# Patient Record
Sex: Female | Born: 1985 | Race: Black or African American | Hispanic: No | Marital: Married | Smoking: Current some day smoker
Health system: Southern US, Community
[De-identification: ages and names within clinical notes are randomized; demographics above are authoritative.]

## PROBLEM LIST (undated history)

## (undated) DIAGNOSIS — R87629 Unspecified abnormal cytological findings in specimens from vagina: Secondary | ICD-10-CM

## (undated) DIAGNOSIS — F410 Panic disorder [episodic paroxysmal anxiety] without agoraphobia: Secondary | ICD-10-CM

## (undated) DIAGNOSIS — O039 Complete or unspecified spontaneous abortion without complication: Secondary | ICD-10-CM

## (undated) HISTORY — DX: Unspecified abnormal cytological findings in specimens from vagina: R87.629

## (undated) HISTORY — PX: HAND SURGERY: SHX662

## (undated) HISTORY — PX: WISDOM TOOTH EXTRACTION: SHX21

## (undated) HISTORY — PX: LEEP: SHX91

## (undated) HISTORY — DX: Complete or unspecified spontaneous abortion without complication: O03.9

---
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
## 2005-06-29 ENCOUNTER — Observation Stay (HOSPITAL_COMMUNITY): Admission: RE | Admit: 2005-06-29 | Discharge: 2005-06-30 | Payer: Self-pay | Admitting: Obstetrics & Gynecology

## 2005-07-10 ENCOUNTER — Observation Stay: Payer: Self-pay | Admitting: Obstetrics & Gynecology

## 2005-07-14 ENCOUNTER — Ambulatory Visit (HOSPITAL_COMMUNITY): Admission: RE | Admit: 2005-07-14 | Discharge: 2005-07-14 | Payer: Self-pay | Admitting: *Deleted

## 2005-07-31 ENCOUNTER — Observation Stay (HOSPITAL_COMMUNITY): Admission: AD | Admit: 2005-07-31 | Discharge: 2005-07-31 | Payer: Self-pay | Admitting: *Deleted

## 2005-08-10 ENCOUNTER — Observation Stay: Payer: Self-pay | Admitting: Obstetrics & Gynecology

## 2005-08-16 ENCOUNTER — Observation Stay: Payer: Self-pay | Admitting: Obstetrics & Gynecology

## 2005-08-19 ENCOUNTER — Inpatient Hospital Stay: Payer: Self-pay

## 2008-03-02 ENCOUNTER — Emergency Department (HOSPITAL_COMMUNITY): Admission: EM | Admit: 2008-03-02 | Discharge: 2008-03-03 | Payer: Self-pay | Admitting: Emergency Medicine

## 2010-01-06 ENCOUNTER — Ambulatory Visit: Payer: Self-pay | Admitting: Obstetrics & Gynecology

## 2010-01-06 ENCOUNTER — Other Ambulatory Visit: Admission: RE | Admit: 2010-01-06 | Discharge: 2010-01-06 | Payer: Self-pay | Admitting: Obstetrics & Gynecology

## 2010-09-30 ENCOUNTER — Emergency Department (HOSPITAL_COMMUNITY)
Admission: EM | Admit: 2010-09-30 | Discharge: 2010-09-30 | Disposition: A | Discharge status: Home / Self Care | Payer: Medicaid Other | Attending: Emergency Medicine | Admitting: Emergency Medicine

## 2010-09-30 DIAGNOSIS — R079 Chest pain, unspecified: Secondary | ICD-10-CM | POA: Insufficient documentation

## 2010-09-30 DIAGNOSIS — F410 Panic disorder [episodic paroxysmal anxiety] without agoraphobia: Secondary | ICD-10-CM | POA: Insufficient documentation

## 2010-09-30 DIAGNOSIS — R0989 Other specified symptoms and signs involving the circulatory and respiratory systems: Secondary | ICD-10-CM | POA: Insufficient documentation

## 2010-09-30 DIAGNOSIS — R0609 Other forms of dyspnea: Secondary | ICD-10-CM | POA: Insufficient documentation

## 2010-10-05 ENCOUNTER — Emergency Department (HOSPITAL_COMMUNITY)
Admission: EM | Admit: 2010-10-05 | Discharge: 2010-10-06 | Disposition: A | Discharge status: Home / Self Care | Payer: Medicaid Other | Attending: Emergency Medicine | Admitting: Emergency Medicine

## 2010-10-05 DIAGNOSIS — R109 Unspecified abdominal pain: Secondary | ICD-10-CM | POA: Insufficient documentation

## 2010-10-05 DIAGNOSIS — N926 Irregular menstruation, unspecified: Secondary | ICD-10-CM | POA: Insufficient documentation

## 2010-10-15 NOTE — Discharge Summary (Signed)
NAMEKEI, LANGHORST          ACCOUNT NO.:  [id number]   MEDICAL RECORD NO.:  [id number]          PATIENT TYPE:  OIB   LOCATION:  A415                          FACILITY:  [facility]   PHYSICIAN:  Langley Gauss, MD     DATE OF BIRTH:  10-May-1986   DATE OF ADMISSION:  07/14/2005  DATE OF DISCHARGE:  02/15/2007LH                                 DISCHARGE SUMMARY   HISTORY OF PRESENT ILLNESS:  The patient is a 25 year old, G3, P1, with one  prior vaginal delivery 6 years ago at age 32, one prior spontaneous AB who  presents to [facility] essentially as an OB unassigned patient at  82 weeks' gestation.  She has received care through [facility] with the usual arrangement that she is seen from residents from  [facility].  All high-risk evaluations are performed at [facility] with  intent on eventually delivering at [facility].  The patient has had 2- to 3-  week duration now of bilateral hip pain which actually extends from the  iliac crest down to the round ligaments bilaterally.  She denies any  significant change in her vaginal discharge.  There is no itching, bleeding  or odor.  She does have good fetal movement.  She states she has had no  problems with prenatal care through this pregnancy with the exception of my  conclusion that she has some discontinuity of her prenatal care due to  multiple visits.  The note indicates that initial ultrasound performed at 61-  1/2 weeks' gestation at [facility] was essentially within normal limits.  Subsequently during this past week, she has had same complaints as she  presents tonight.  She has been seen at [facility] and also at [facility].  She states that no definite diagnosis has been given to her other than  threatened preterm labor.  She was given a prescription for Darvocet by one  of the training physicians from [facility].  On subsequent visit, another  training physician advised her against taking this.   MEDICATIONS:  1.  Prenatal vitamins.  2.  Recently completed a course of amoxicillin on May 26, 2005.  3.  Ventolin inhaler for occasional wheezing.   PAST MEDICAL HISTORY:  One prior vaginal delivery who is now 25 years old.  She does have other family members to provide complete care for that child.  The remainder of past medical history is otherwise noncontributory.  The  patient otherwise has no medical or surgical history.  She denies any  previous history of chronic back pain.  She also denies any history which  could be consistent with irritable bowel syndrome.   PAST OBSTETRICAL HISTORY:  One term pregnancy, one spontaneous AB and this  current pregnancy.   PRENATAL LABORATORY DATA:  Interestingly, in reviewing her prenatal record  from [facility], the patient refused performance of an HIV test on July 07, 2005.  Otherwise, RPR is nonreactive.  Hemoglobin 11.9.  O positive blood  type.  RPR nonreactive.  GC and Chlamydia cultures negative at the time at  which they were performed.  Hepatitis B surface antigen was negative.   PHYSICAL EXAMINATION:  GENERAL:  She is noted to be in no acute distress,  but she did require some assistance in sitting up from the wheelchair.  VITAL SIGNS:  Blood pressure 125/70, pulse 80, respirations 20.  HEENT:  Negative.  No adenopathy.  NECK:  Supple.  Thyroid nonpalpable.  LUNGS:  Clear.  CARDIAC:  Regular rate and rhythm.  ABDOMEN:  Soft and nontender.  The patient does complain of some pain with  manipulation of the abdominal wall which does seem to be stretched fairly  tightly in this small patient at this advanced gestation.  Fundal height is  measured at 36 cm.  Presenting part indeterminate.  External fetal monitor  reveals a reassuring fetal heart rate.  I did request a nonstress test for  the indication of threatened preterm labor.  The patient is known to be  contracting about every 8 minutes of very mild intensity.  Fetal  heart rate  is reassuring.  Reactive nonstress test.  PELVIC:  Cervix is closed.  Vertex is not engaged.  Limited OB ultrasound by  Dr. Lisette Grinder reveals vertex presentation, normal amniotic fluid volume, good  fetal movement identified, posterior placenta with no placenta previa.  No  retroplacental clots are identified.  Normal amniotic fluid volume.  Fetal  cardiac activity is seen.  Vertex presentation is confirmed.   LABORATORY DATA AND X-RAY FINDINGS:  Urinalysis which is completely negative  with very small amounts of leukocyte esterase.  Urine drug screen is  completely negative.  Urinalysis all pertinent for many epithelial cells, 0-  2 wbc's and only rare bacteria.  Hemoglobin 11.2, hematocrit 32.8 with a  white count of 8.8.   OBSERVATION SUMMARY:  On examination, the patient has bilateral lumbar back  pain, but no appreciable area of point tenderness, no prior history of back  disorders.  She is having varying episodic uterine contractions, but not  significant enough to result in any dilatation.  Thus, at present, it seems  that the bilateral lumbar and hip pain which the patient is complaining  about could be considered a normal part of her pregnancy with an accentuated  lumbar lordosis and weakening of the rectus muscle.  The patient, prior to  discharge, did have one episode of diarrhea and has been treated with  Lomotil 2 p.o. x1 and discharged to home on Lomotil 2 p.o. and then one  every 6 hours p.r.n.  She also is given a prescription for Lortab.  The  patient is advised to continue to follow up with her previous prenatal  Ay[facility] Andes.      Langley Gauss, MD  Electronically Signed     DC/MEDQ  D:  07/14/2005  T:  07/15/2005  Job:  [id number]   cc:   [facility]

## 2010-10-15 NOTE — Discharge Summary (Signed)
Lisa Hanna, Lisa Hanna          ACCOUNT NO.:  [id number]   MEDICAL RECORD NO.:  [id number]          PATIENT TYPE:  OIB   LOCATION:  A415                          FACILITY:  [facility]   PHYSICIAN:  Langley Gauss, MD     DATE OF BIRTH:  Sep 14, 1985   DATE OF ADMISSION:  07/31/2005  DATE OF DISCHARGE:  03/04/2007LH                                 DISCHARGE SUMMARY   HISTORY OF PRESENT ILLNESS:  The patient is a 25 year old, black female who  is essentially unassigned receiving prenatal care at [facility].  She is a G3, P1 at about 36+ weeks' gestation and initially presented  complaining of pain under her right breast.  She was subsequently noted to  have  irregular uterine contractions.   PRENATAL COURSE:  Her entire prenatal record is reviewed including a  previous dictation when the patient was completely evaluated by me as an OB  unassigned on labor and delivery on July 14, 2005.  The patient's past  medical history and prenatal course is essentially unchanged from that time.   PHYSICAL EXAMINATION:  GENERAL:  She is in no acute distress.  VITAL SIGNS:  Blood pressure 115/70, pulse rate 80, respirations 20.  ABDOMEN:  Soft and nontender.  Gravid uterus is identified.  Vertex  presentation with Leopold's maneuvers.  Full bladder.  PELVIC:  Normal external genitalia, no lesions or ulcerations identified.  Cervix is 1-2 with presenting part nonengaged and 50% effaced.  External  fetal monitor reveals reassuring fetal heart rate.  No decelerations noted.  Irregular mild uterine contractions.   ASSESSMENT:  1.  A 36-week intrauterine pregnancy with breast pain with undefined      etiology.  2.  Abdominal pain with false labor.  Signs and symptoms of the labor is      reviewed with the patient.   PLAN:  She is discharged to home at this time and will likely continue her  prenatal care through [facility].      Langley Gauss, MD  Electronically  Signed     DC/MEDQ  D:  07/31/2005  T:  08/01/2005  Job:  [id number]

## 2010-10-15 NOTE — H&P (Signed)
Lisa Hanna, Lisa Hanna          ACCOUNT NO.:  [id number]   MEDICAL RECORD NO.:  [id number]          PATIENT TYPE:  OIB   LOCATION:  A415                          FACILITY:  [facility]   PHYSICIAN:  Tilda Burrow, M.D. DATE OF BIRTH:  1986/01/13   DATE OF ADMISSION:  06/29/2005  DATE OF DISCHARGE:  LH                                HISTORY & PHYSICAL   OBSERVATION ADMISSION   REASON FOR ADMISSION:  Pregnancy at 32 weeks and 2 days. Complaining of  uterine contractions that have been regular since early a.m.   MEDICAL HISTORY:  Negative.   SURGICAL HISTORY:  Negative.   PHYSICAL EXAMINATION:  Today, vital signs are stable.  She is having some  uterine contractions which we start terbutaline protocol on.  The cervix is  a floppy, 2 cm, very thick, presenting part is in the pelvis.   PLAN:  We are going to admit for preterm uterine contractions, due to the  terbutaline protocol, obtain cultures, start her on IV antibiotics, observe  during the course of the night.  If there is no change in her condition we  will send her home in the morning.      Lisa Hanna, Lisa Hanna      Tilda Burrow, M.D.  Electronically Signed    DL/MEDQ  D:  16/02/9603  T:  06/29/2005  Job:  [id number]   cc:   [facility]   [facility]

## 2010-11-11 ENCOUNTER — Other Ambulatory Visit (HOSPITAL_COMMUNITY): Payer: Self-pay | Admitting: *Deleted

## 2010-11-16 ENCOUNTER — Ambulatory Visit (HOSPITAL_COMMUNITY)
Admission: RE | Admit: 2010-11-16 | Discharge: 2010-11-16 | Disposition: A | Discharge status: Home / Self Care | Payer: Medicaid Other | Source: Ambulatory Visit | Attending: *Deleted | Admitting: *Deleted

## 2010-11-16 ENCOUNTER — Ambulatory Visit (HOSPITAL_COMMUNITY)
Admission: RE | Admit: 2010-11-16 | Payer: Medicaid Other | Source: Ambulatory Visit | Attending: *Deleted | Admitting: *Deleted

## 2010-11-16 ENCOUNTER — Ambulatory Visit (HOSPITAL_COMMUNITY)
Admission: RE | Admit: 2010-11-16 | Discharge: 2010-11-16 | Disposition: A | Discharge status: Home / Self Care | Payer: Medicaid Other | Source: Ambulatory Visit | Attending: Internal Medicine | Admitting: Internal Medicine

## 2010-11-16 DIAGNOSIS — R109 Unspecified abdominal pain: Secondary | ICD-10-CM | POA: Insufficient documentation

## 2010-11-16 DIAGNOSIS — N949 Unspecified condition associated with female genital organs and menstrual cycle: Secondary | ICD-10-CM | POA: Insufficient documentation

## 2010-11-27 DIAGNOSIS — Z9889 Other specified postprocedural states: Secondary | ICD-10-CM

## 2010-11-27 DIAGNOSIS — IMO0002 Reserved for concepts with insufficient information to code with codable children: Secondary | ICD-10-CM

## 2010-12-13 ENCOUNTER — Encounter: Payer: Medicaid Other | Admitting: Family Medicine

## 2011-02-24 ENCOUNTER — Encounter: Payer: Medicaid Other | Admitting: Family Medicine

## 2011-02-24 ENCOUNTER — Encounter: Payer: Self-pay | Admitting: Obstetrics & Gynecology

## 2011-09-02 ENCOUNTER — Encounter (HOSPITAL_COMMUNITY): Payer: Self-pay

## 2011-09-02 ENCOUNTER — Emergency Department (HOSPITAL_COMMUNITY)
Admission: EM | Admit: 2011-09-02 | Discharge: 2011-09-02 | Disposition: A | Discharge status: Home / Self Care | Payer: Medicaid Other | Attending: Emergency Medicine | Admitting: Emergency Medicine

## 2011-09-02 DIAGNOSIS — B9689 Other specified bacterial agents as the cause of diseases classified elsewhere: Secondary | ICD-10-CM | POA: Insufficient documentation

## 2011-09-02 DIAGNOSIS — N939 Abnormal uterine and vaginal bleeding, unspecified: Secondary | ICD-10-CM

## 2011-09-02 DIAGNOSIS — N760 Acute vaginitis: Secondary | ICD-10-CM | POA: Insufficient documentation

## 2011-09-02 DIAGNOSIS — N898 Other specified noninflammatory disorders of vagina: Secondary | ICD-10-CM | POA: Insufficient documentation

## 2011-09-02 DIAGNOSIS — A499 Bacterial infection, unspecified: Secondary | ICD-10-CM | POA: Insufficient documentation

## 2011-09-02 DIAGNOSIS — R109 Unspecified abdominal pain: Secondary | ICD-10-CM | POA: Insufficient documentation

## 2011-09-02 HISTORY — DX: Panic disorder (episodic paroxysmal anxiety): F41.0

## 2011-09-02 LAB — URINALYSIS, ROUTINE W REFLEX MICROSCOPIC
Ketones, ur: NEGATIVE mg/dL
Leukocytes, UA: NEGATIVE
Nitrite: NEGATIVE
Protein, ur: NEGATIVE mg/dL
Urobilinogen, UA: 0.2 mg/dL (ref 0.0–1.0)

## 2011-09-02 LAB — CBC
HCT: 36.8 % (ref 36.0–46.0)
Hemoglobin: 12 g/dL (ref 12.0–15.0)
MCHC: 32.6 g/dL (ref 30.0–36.0)
MCV: 103.7 fL — ABNORMAL HIGH (ref 78.0–100.0)
RDW: 12.3 % (ref 11.5–15.5)

## 2011-09-02 LAB — BASIC METABOLIC PANEL
BUN: 6 mg/dL (ref 6–23)
CO2: 26 mEq/L (ref 19–32)
Calcium: 9.4 mg/dL (ref 8.4–10.5)
Chloride: 103 mEq/L (ref 96–112)
Creatinine, Ser: 0.68 mg/dL (ref 0.50–1.10)
GFR calc Af Amer: >90 mL/min (ref >90)

## 2011-09-02 LAB — DIFFERENTIAL
Basophils Absolute: 0 10*3/uL (ref 0.0–0.1)
Basophils Relative: 0 % (ref 0–1)
Eosinophils Relative: 0 % (ref 0–5)
Monocytes Absolute: 0.3 10*3/uL (ref 0.1–1.0)
Monocytes Relative: 7 % (ref 3–12)
Neutro Abs: 3 10*3/uL (ref 1.7–7.7)

## 2011-09-02 LAB — POCT PREGNANCY, URINE: Preg Test, Ur: NEGATIVE

## 2011-09-02 LAB — WET PREP, GENITAL: Yeast Wet Prep HPF POC: NONE SEEN

## 2011-09-02 MED ORDER — METRONIDAZOLE 500 MG PO TABS: 500.0000 mg | ORAL_TABLET | Freq: Two times a day (BID) | ORAL | Status: AC

## 2011-09-02 NOTE — ED Notes (Signed)
Pt presents with abdominal cramping and vaginal bleeding. Pt states she was just placed on Ortho Evra patch.

## 2011-09-02 NOTE — Discharge Instructions (Signed)
Bacterial Vaginosis Bacterial vaginosis is an infection of the vagina. A healthy vagina has many kinds of good germs (bacteria). Sometimes the number of good germs can change. This allows bad germs to move in and cause an infection. You may be given medicine (antibiotics) to treat the infection. Or, you may not need treatment at all. HOME CARE  Take your medicine as told. Finish them even if you start to feel better.   Do not have sex until you finish your medicine.   Do not douche.   Practice safe sex.   Tell your sex partner that you have an infection. They should see their doctor for treatment if they have problems.  GET HELP RIGHT AWAY IF:  You do not get better after 3 days of treatment.   You have grey fluid (discharge) coming from your vagina.   You have pain.   You have a temperature of 102 F (38.9 C) or higher.  MAKE SURE YOU:   Understand these instructions.   Will watch your condition.   Will get help right away if you are not doing well or get worse.  Document Released: 02/23/2008 Document Revised: 05/05/2011 Document Reviewed: 02/23/2008 [facility] Patient Information 2012 [place], [place].Abnormal Vaginal Bleeding Abnormal vaginal bleeding means bleeding from the vagina that is not your normal menstrual period. Bleeding may be heavy or light. It may last for days or come and go. There are many problems that may cause this. HOME CARE  Keep track of your periods on a calendar if they are not regular.   Write down:   How often pads or tampons are changed.   The size and number of clots, if there are any.   A change in the color of the blood.   A change in the amount of blood.   Any smell.   The time and strength of cramps or pain.   Limit activity as told.   Eat a healthy diet.   Do not have sex (intercourse) until your doctor says it is okay.   Never have unprotected sex unless you are trying to get pregnant.   Only take medicine as told by your  doctor.  GET HELP RIGHT AWAY IF:   You get dizzy or feel faint when standing up.   You have to change pads or tampons more than once an hour.   You feel a sudden change in your pain.   You start bleeding heavily.   You develop a fever.  MAKE SURE YOU:  Understand these instructions.   Will watch your condition.   Will get help right away if you are not doing well or get worse.  Document Released: 03/13/2009 Document Revised: 05/05/2011 Document Reviewed: 03/13/2009 [facility] Patient Information 2012 [place], [place].

## 2011-09-02 NOTE — ED Notes (Signed)
Pt reports had recently stopped the depo shot and has been bleeding for 5 weeks.  Says was started on a birth control patch 2 weeks ago but is still bleeding and cramping.

## 2011-09-03 LAB — GC/CHLAMYDIA PROBE AMP, GENITAL: GC Probe Amp, Genital: NEGATIVE

## 2011-09-03 NOTE — ED Provider Notes (Signed)
History     CSN: [id number]  Arrival date & time 09/02/11  1200   First MD Initiated Contact with Patient 09/02/11 1318      Chief Complaint  Patient presents with  . Vaginal Bleeding  . Abdominal Cramping    (Consider location/radiation/quality/duration/timing/severity/associated sxs/prior treatment) HPI Comments: Patient complains of lower abdominal cramping and vaginal bleeding for several days. She describes the the cramping as similar to menstrual cramps. She states the cramping and bleeding have been intermittent. She also states that she has recently began using a Ortho Evra patch. She states at times the bleeding is heavier than a menstrual period.  She denies dysuria, fever, vaginal discharge, or back pain. She states that nothing makes her pain worse she has not taken any medications at home for cramping  Patient is a 26 y.o. female presenting with vaginal bleeding and cramps. The history is provided by the patient.  Vaginal Bleeding This is a new problem. The current episode started in the past 7 days. The problem occurs intermittently. The problem has been unchanged. Associated symptoms include abdominal pain. Pertinent negatives include no chest pain, chills, congestion, fever, headaches, nausea, neck pain, numbness, sore throat, vomiting or weakness. The symptoms are aggravated by nothing. She has tried nothing for the symptoms. The treatment provided no relief.  Abdominal Cramping The primary symptoms of the illness include abdominal pain and vaginal bleeding. The primary symptoms of the illness do not include fever, nausea, vomiting, dysuria or vaginal discharge.  Symptoms associated with the illness do not include chills, hematuria or back pain.    Past Medical History  Diagnosis Date  . Panic attack     History reviewed. No pertinent past surgical history.  No family history on file.  History  Substance Use Topics  . Smoking status: Never Smoker   . Smokeless  tobacco: Not on file  . Alcohol Use: No    OB History    Grav Para Term Preterm Abortions TAB SAB Ect Mult Living                  Review of Systems  Constitutional: Negative for fever and chills.  HENT: Negative for congestion, sore throat and neck pain.   Cardiovascular: Negative for chest pain.  Gastrointestinal: Positive for abdominal pain. Negative for nausea and vomiting.  Genitourinary: Positive for vaginal bleeding and menstrual problem. Negative for dysuria, hematuria, flank pain, vaginal discharge and difficulty urinating.  Musculoskeletal: Negative for back pain.  Skin: Negative.   Neurological: Negative for dizziness, weakness, numbness and headaches.  All other systems reviewed and are negative.    Allergies  Latex  Home Medications   Current Outpatient Rx  Name Route Sig Dispense Refill  . NORELGESTROMIN-ETH ESTRADIOL 150-20 MCG/24HR TD PTWK Transdermal Place 1 patch onto the skin once a week. Patient change on Thursday..    . METRONIDAZOLE 500 MG PO TABS Oral Take 1 tablet (500 mg total) by mouth 2 (two) times daily. For 7 days 14 tablet 0    BP 110/76  Pulse 85  Temp(Src) 97.8 F (36.6 C) (Oral)  Resp 20  Ht 5\' 6"  (1.676 m)  Wt 129 lb 12.8 oz (58.877 kg)  BMI 20.95 kg/m2  SpO2 100%  Physical Exam  Nursing note and vitals reviewed. Constitutional: She is oriented to person, place, and time. She appears well-developed and well-nourished. No distress.  HENT:  Head: Normocephalic and atraumatic.  Cardiovascular: Normal rate, regular rhythm and normal heart sounds.   Pulmonary/Chest:  Effort normal and breath sounds normal. No respiratory distress.  Abdominal: Soft. She exhibits no distension. There is no tenderness. There is no rebound and no guarding.  Genitourinary: Uterus normal. Cervix exhibits no motion tenderness. Right adnexum displays no mass and no tenderness. Left adnexum displays no mass and no tenderness. There is bleeding around the vagina.  No foreign body around the vagina. No vaginal discharge found.  Musculoskeletal: Normal range of motion. She exhibits no edema.  Neurological: She is alert and oriented to person, place, and time. She exhibits normal muscle tone. Coordination normal.  Skin: Skin is warm and dry.    ED Course  Procedures (including critical care time)  Results for orders placed during the hospital encounter of 09/02/11  URINALYSIS, ROUTINE W REFLEX MICROSCOPIC      Component Value Range   Color, Urine YELLOW  YELLOW    APPearance CLEAR  CLEAR    Specific Gravity, Urine <1.005 (*) 1.005 - 1.030    pH 6.5  5.0 - 8.0    Glucose, UA NEGATIVE  NEGATIVE (mg/dL)   Hgb urine dipstick SMALL (*) NEGATIVE    Bilirubin Urine NEGATIVE  NEGATIVE    Ketones, ur NEGATIVE  NEGATIVE (mg/dL)   Protein, ur NEGATIVE  NEGATIVE (mg/dL)   Urobilinogen, UA 0.2  0.0 - 1.0 (mg/dL)   Nitrite NEGATIVE  NEGATIVE    Leukocytes, UA NEGATIVE  NEGATIVE   POCT PREGNANCY, URINE      Component Value Range   Preg Test, Ur NEGATIVE  NEGATIVE   URINE MICROSCOPIC-ADD ON      Component Value Range   Squamous Epithelial / LPF FEW (*) RARE    WBC, UA 0-2  <3 (WBC/hpf)   RBC / HPF 3-6  <3 (RBC/hpf)  CBC      Component Value Range   WBC 4.9  4.0 - 10.5 (K/uL)   RBC 3.55 (*) 3.87 - 5.11 (MIL/uL)   Hemoglobin 12.0  12.0 - 15.0 (g/dL)   HCT 16.1  09.6 - 04.5 (%)   MCV 103.7 (*) 78.0 - 100.0 (fL)   MCH 33.8  26.0 - 34.0 (pg)   MCHC 32.6  30.0 - 36.0 (g/dL)   RDW 40.9  81.1 - 91.4 (%)   Platelets 296  150 - 400 (K/uL)  DIFFERENTIAL      Component Value Range   Neutrophils Relative 60  43 - 77 (%)   Neutro Abs 3.0  1.7 - 7.7 (K/uL)   Lymphocytes Relative 32  12 - 46 (%)   Lymphs Abs 1.6  0.7 - 4.0 (K/uL)   Monocytes Relative 7  3 - 12 (%)   Monocytes Absolute 0.3  0.1 - 1.0 (K/uL)   Eosinophils Relative 0  0 - 5 (%)   Eosinophils Absolute 0.0  0.0 - 0.7 (K/uL)   Basophils Relative 0  0 - 1 (%)   Basophils Absolute 0.0  0.0 - 0.1  (K/uL)  BASIC METABOLIC PANEL      Component Value Range   Sodium 138  135 - 145 (mEq/L)   Potassium 3.8  3.5 - 5.1 (mEq/L)   Chloride 103  96 - 112 (mEq/L)   CO2 26  19 - 32 (mEq/L)   Glucose, Bld 76  70 - 99 (mg/dL)   BUN 6  6 - 23 (mg/dL)   Creatinine, Ser 7.82  0.50 - 1.10 (mg/dL)   Calcium 9.4  8.4 - 95.6 (mg/dL)   GFR calc non Af Amer >90  >90 (  mL/min)   GFR calc Af Amer >90  >90 (mL/min)  WET PREP, GENITAL      Component Value Range   Yeast Wet Prep HPF POC NONE SEEN  NONE SEEN    Trich, Wet Prep NONE SEEN  NONE SEEN    Clue Cells Wet Prep HPF POC FEW (*) NONE SEEN    WBC, Wet Prep HPF POC RARE (*) NONE SEEN       1. Abnormal vaginal bleeding   2. Bacterial vaginosis     GC and Chlamydia culture is pending  MDM   Patient is smiling and alert. She is nontoxic appearing. Vital signs are stable. Abdomen remains soft and nontender without guarding or rebound. Abnormal vaginal bleeding is likely related to recently starting a birth control patch  Laboratory results from today's visit were reviewed by me and appear within normal limits. I will treat for probable bacterial vaginosis given results of the wet prep  Patient agrees to close followup with her gynecologist. I discussed the results of today's testing patient, she verbalized understanding and agrees to the care plan.   Brinkley Peet L. Emiliya Chretien, [place] 09/03/11 1021

## 2011-09-04 NOTE — ED Provider Notes (Signed)
Medical screening examination/treatment/procedure(s) were performed by non-physician practitioner and as supervising physician I was immediately available for consultation/collaboration.    Benny Lennert, MD 09/04/11 [phone]

## 2011-12-25 ENCOUNTER — Emergency Department (HOSPITAL_COMMUNITY)
Admission: EM | Admit: 2011-12-25 | Discharge: 2011-12-25 | Disposition: A | Discharge status: Home / Self Care | Payer: Medicaid Other | Attending: Emergency Medicine | Admitting: Emergency Medicine

## 2011-12-25 ENCOUNTER — Emergency Department (HOSPITAL_COMMUNITY): Payer: Medicaid Other

## 2011-12-25 ENCOUNTER — Encounter (HOSPITAL_COMMUNITY): Payer: Self-pay | Admitting: *Deleted

## 2011-12-25 DIAGNOSIS — S20219A Contusion of unspecified front wall of thorax, initial encounter: Secondary | ICD-10-CM

## 2011-12-25 DIAGNOSIS — R071 Chest pain on breathing: Secondary | ICD-10-CM | POA: Insufficient documentation

## 2011-12-25 MED ORDER — IBUPROFEN 800 MG PO TABS: 800.0000 mg | ORAL_TABLET | Freq: Three times a day (TID) | ORAL | Status: AC

## 2011-12-25 MED ORDER — HYDROCODONE-ACETAMINOPHEN 5-325 MG PO TABS: ORAL_TABLET | ORAL | Status: AC
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
## 2012-01-04 NOTE — ED Provider Notes (Signed)
Medical screening examination/treatment/procedure(s) were conducted as a shared visit with non-physician practitioner(s) and myself.  I personally evaluated the patient during the encounter.  Left lateral chest wall pain secondary to trauma. Chest x-ray negative.  Donnetta Hutching, MD 01/04/12 1455

## 2012-05-14 ENCOUNTER — Encounter (HOSPITAL_COMMUNITY): Payer: Self-pay

## 2012-05-14 ENCOUNTER — Emergency Department (HOSPITAL_COMMUNITY): Payer: Medicaid Other

## 2012-05-14 ENCOUNTER — Emergency Department (HOSPITAL_COMMUNITY)
Admission: EM | Admit: 2012-05-14 | Discharge: 2012-05-14 | Disposition: A | Discharge status: Home / Self Care | Payer: Medicaid Other | Attending: Emergency Medicine | Admitting: Emergency Medicine

## 2012-05-14 DIAGNOSIS — N760 Acute vaginitis: Secondary | ICD-10-CM | POA: Insufficient documentation

## 2012-05-14 DIAGNOSIS — Z3201 Encounter for pregnancy test, result positive: Secondary | ICD-10-CM | POA: Insufficient documentation

## 2012-05-14 DIAGNOSIS — Z8659 Personal history of other mental and behavioral disorders: Secondary | ICD-10-CM | POA: Insufficient documentation

## 2012-05-14 DIAGNOSIS — B9689 Other specified bacterial agents as the cause of diseases classified elsewhere: Secondary | ICD-10-CM

## 2012-05-14 DIAGNOSIS — Z3490 Encounter for supervision of normal pregnancy, unspecified, unspecified trimester: Secondary | ICD-10-CM

## 2012-05-14 DIAGNOSIS — R109 Unspecified abdominal pain: Secondary | ICD-10-CM | POA: Insufficient documentation

## 2012-05-14 LAB — CBC
MCH: 34 pg (ref 26.0–34.0)
MCHC: 33.7 g/dL (ref 30.0–36.0)
Platelets: 223 10*3/uL (ref 150–400)
RDW: 12 % (ref 11.5–15.5)

## 2012-05-14 LAB — WET PREP, GENITAL
Trich, Wet Prep: NONE SEEN
Yeast Wet Prep HPF POC: NONE SEEN

## 2012-05-14 LAB — URINALYSIS, ROUTINE W REFLEX MICROSCOPIC
Glucose, UA: NEGATIVE mg/dL
Ketones, ur: NEGATIVE mg/dL
Leukocytes, UA: NEGATIVE
Nitrite: NEGATIVE
Protein, ur: NEGATIVE mg/dL

## 2012-05-14 LAB — ABO/RH: ABO/RH(D): O POS

## 2012-05-14 LAB — PREGNANCY, URINE: Preg Test, Ur: POSITIVE — AB

## 2012-05-14 LAB — HCG, QUANTITATIVE, PREGNANCY: hCG, Beta Chain, Quant, S: 68472 m[IU]/mL — ABNORMAL HIGH (ref <5)

## 2012-05-14 MED ORDER — AZITHROMYCIN 250 MG PO TABS
1000.0000 mg | ORAL_TABLET | Freq: Once | ORAL | Status: AC
  Administered 2012-05-14: 1000 mg via ORAL
  Filled 2012-05-14: qty 4

## 2012-05-14 MED ORDER — CEFTRIAXONE SODIUM 250 MG IJ SOLR
250.0000 mg | Freq: Once | INTRAMUSCULAR | Status: AC
  Administered 2012-05-14: 250 mg via INTRAMUSCULAR
  Filled 2012-05-14: qty 250

## 2012-05-14 MED ORDER — ONDANSETRON 8 MG PO TBDP
8.0000 mg | ORAL_TABLET | Freq: Once | ORAL | Status: AC
  Administered 2012-05-14: 8 mg via ORAL
  Filled 2012-05-14: qty 1

## 2012-05-14 MED ORDER — METRONIDAZOLE 500 MG PO TABS: 500.0000 mg | ORAL_TABLET | Freq: Two times a day (BID) | ORAL | Status: DC

## 2012-05-14 MED ORDER — PRENATAL COMPLETE 14-0.4 MG PO TABS: 1.0000 | ORAL_TABLET | Freq: Every day | ORAL | Status: DC

## 2012-05-14 MED ORDER — ONDANSETRON HCL 4 MG PO TABS: 4.0000 mg | ORAL_TABLET | Freq: Three times a day (TID) | ORAL | Status: DC | PRN

## 2012-05-14 NOTE — ED Notes (Signed)
Pt reports n/v and some abd pain since Saturday.  Pt reports her LMP Nov 1.  Pt reports " I could be pregnant".

## 2012-05-14 NOTE — ED Provider Notes (Signed)
History     CSN: [id number]  Arrival date & time 05/14/12  4132   First MD Initiated Contact with Patient 05/14/12 [phone]      Chief Complaint  Patient presents with  . Nausea  . Emesis  . Abdominal Pain     HPI Pt was seen at 0800.   Per pt, c/o gradual onset and persistence of constant lower abd "pain" for the past 2 days.  Has been associated with several episodes of N/V.  States her LMP was approx 11/1 and is "not sure if I'm pregnant."  Denies dysuria, no back pain, no diarrhea, no vaginal bleeding/discharge, no fevers.    Past Medical History  Diagnosis Date  . Panic attack     History reviewed. No pertinent past surgical history.   History  Substance Use Topics  . Smoking status: Never Smoker   . Smokeless tobacco: Not on file  . Alcohol Use: No    Review of Systems ROS: Statement: All systems negative except as marked or noted in the HPI; Constitutional: Negative for fever and chills. ; ; Eyes: Negative for eye pain, redness and discharge. ; ; ENMT: Negative for ear pain, hoarseness, nasal congestion, sinus pressure and sore throat. ; ; Cardiovascular: Negative for chest pain, palpitations, diaphoresis, dyspnea and peripheral edema. ; ; Respiratory: Negative for cough, wheezing and stridor. ; ; Gastrointestinal: +N/V. Negative for diarrhea, abdominal pain, blood in stool, hematemesis, jaundice and rectal bleeding. . ; ; Genitourinary: Negative for dysuria, flank pain and hematuria. ; ; GYN:  +pelvic pain. No vaginal bleeding, no vaginal discharge, no vulvar pain.;;  Musculoskeletal: Negative for back pain and neck pain. Negative for swelling and trauma.; ; Skin: Negative for pruritus, rash, abrasions, blisters, bruising and skin lesion.; ; Neuro: Negative for headache, lightheadedness and neck stiffness. Negative for weakness, altered level of consciousness , altered mental status, extremity weakness, paresthesias, involuntary movement, seizure and syncope.        Allergies  Latex  Home Medications  No current outpatient prescriptions on file.  BP 100/70  Pulse 93  Temp 98.4 F (36.9 C) (Oral)  Resp 20  Ht 5\' 6"  (1.676 m)  Wt 133 lb 14.4 oz (60.737 kg)  BMI 21.61 kg/m2  SpO2 100%  LMP 04/19/2012  Physical Exam 0805: Physical examination:  Nursing notes reviewed; Vital signs and O2 SAT reviewed;  Constitutional: Well developed, Well nourished, Well hydrated, In no acute distress; Head:  Normocephalic, atraumatic; Eyes: EOMI, PERRL, No scleral icterus; ENMT: Mouth and pharynx normal, Mucous membranes moist; Neck: Supple, Full range of motion, No lymphadenopathy; Cardiovascular: Regular rate and rhythm, No murmur, rub, or gallop; Respiratory: Breath sounds clear & equal bilaterally, No rales, rhonchi, wheezes.  Speaking full sentences with ease, Normal respiratory effort/excursion; Chest: Nontender, Movement normal; Abdomen: Soft, +mild suprapubic tenderness to palp. No rebound or guarding. Nondistended, Normal bowel sounds; Genitourinary: No CVA tenderness; Pelvic exam performed with permission of pt and female ED tech assist during exam.  External genitalia w/o lesions. Vaginal vault with thick white discharge.  Cervix w/o lesions, not friable, GC/chlam and wet prep obtained and sent to lab.  Bimanual exam w/o CMT, uterine or adnexal tenderness.;; Extremities: Pulses normal, No tenderness, No edema, No calf edema or asymmetry.; Neuro: AA&Ox3, Major CN grossly intact.  Speech clear. No gross focal motor or sensory deficits in extremities.; Skin: Color normal, Warm, Dry.   ED Course  Procedures    MDM  MDM Reviewed: nursing note and vitals Interpretation: labs  and ultrasound     Results for orders placed during the hospital encounter of 05/14/12  PREGNANCY, URINE      Component Value Range   Preg Test, Ur POSITIVE (*) NEGATIVE  URINALYSIS, ROUTINE W REFLEX MICROSCOPIC      Component Value Range   Color, Urine YELLOW  YELLOW    APPearance CLEAR  CLEAR   Specific Gravity, Urine <1.005 (*) 1.005 - 1.030   pH 7.0  5.0 - 8.0   Glucose, UA NEGATIVE  NEGATIVE mg/dL   Hgb urine dipstick NEGATIVE  NEGATIVE   Bilirubin Urine NEGATIVE  NEGATIVE   Ketones, ur NEGATIVE  NEGATIVE mg/dL   Protein, ur NEGATIVE  NEGATIVE mg/dL   Urobilinogen, UA 0.2  0.0 - 1.0 mg/dL   Nitrite NEGATIVE  NEGATIVE   Leukocytes, UA NEGATIVE  NEGATIVE  WET PREP, GENITAL      Component Value Range   Yeast Wet Prep HPF POC NONE SEEN  NONE SEEN   Trich, Wet Prep NONE SEEN  NONE SEEN   Clue Cells Wet Prep HPF POC MODERATE (*) NONE SEEN   WBC, Wet Prep HPF POC MODERATE (*) NONE SEEN  CBC      Component Value Range   WBC 6.1  4.0 - 10.5 K/uL   RBC 3.68 (*) 3.87 - 5.11 MIL/uL   Hemoglobin 12.5  12.0 - 15.0 g/dL   HCT 45.4  09.8 - 11.9 %   MCV 100.8 (*) 78.0 - 100.0 fL   MCH 34.0  26.0 - 34.0 pg   MCHC 33.7  30.0 - 36.0 g/dL   RDW 14.7  82.9 - 56.2 %   Platelets 223  150 - 400 K/uL  ABO/RH      Component Value Range   ABO/RH(D) O POS    HCG, QUANTITATIVE, PREGNANCY      Component Value Range   hCG, Beta Chain, Quant, S 13086 (*) <5 mIU/mL   US Ob Comp Less 14 Wks 05/14/2012  *RADIOLOGY REPORT*  Clinical Data: First trimester pregnancy.  Pelvic pain.  OBSTETRIC <14 WK [place] AND TRANSVAGINAL OB US  Technique:  Both transabdominal and transvaginal ultrasound examinations were performed for complete evaluation of the gestation as well as the maternal uterus, adnexal regions, and pelvic cul-de-sac.  Transvaginal technique was performed to assess early pregnancy.  Comparison:  None.  Intrauterine gestational sac:  Visualized/normal in shape. Yolk sac: Visualized. Embryo: Visualized. Cardiac Activity: Visualized. Heart Rate: 122 bpm  CRL: 8.6  mm;  6-week-6-days        [place] EDC: 01/01/2013  Maternal uterus/adnexae: There is a small subchorionic hematoma.  Unfused amnion is noted. Probable corpus luteum is noted within the right ovary.  There is no adnexal mass  or significant free pelvic fluid.  IMPRESSION:  1.  Single live intrauterine gestation with best estimated gestational age of [redacted] weeks 6 days. 2.  Small subchorionic hematoma. 3.  No suspicious adnexal findings.   Original Report Authenticated By: Carey Bullocks, M.D.       [phone]:  Will tx for BV.  GC/chalm pending.  Dx and testing d/w pt.  Questions answered.  Verb understanding, agreeable to d/c home with outpt f/u with her OB.      Laray Anger, DO 05/16/12 [phone]

## 2012-05-15 LAB — GC/CHLAMYDIA PROBE AMP
CT Probe RNA: NEGATIVE
GC Probe RNA: NEGATIVE

## 2012-05-16 LAB — URINE CULTURE

## 2012-11-07 ENCOUNTER — Encounter (HOSPITAL_COMMUNITY): Payer: Self-pay | Admitting: Emergency Medicine

## 2012-11-07 ENCOUNTER — Emergency Department (HOSPITAL_COMMUNITY)
Admission: EM | Admit: 2012-11-07 | Discharge: 2012-11-07 | Disposition: A | Discharge status: Home / Self Care | Payer: Medicaid Other | Attending: Emergency Medicine | Admitting: Emergency Medicine

## 2012-11-07 DIAGNOSIS — X503XXA Overexertion from repetitive movements, initial encounter: Secondary | ICD-10-CM | POA: Insufficient documentation

## 2012-11-07 DIAGNOSIS — Y9389 Activity, other specified: Secondary | ICD-10-CM | POA: Insufficient documentation

## 2012-11-07 DIAGNOSIS — Z8659 Personal history of other mental and behavioral disorders: Secondary | ICD-10-CM | POA: Insufficient documentation

## 2012-11-07 DIAGNOSIS — S335XXA Sprain of ligaments of lumbar spine, initial encounter: Secondary | ICD-10-CM | POA: Insufficient documentation

## 2012-11-07 DIAGNOSIS — S39012A Strain of muscle, fascia and tendon of lower back, initial encounter: Secondary | ICD-10-CM

## 2012-11-07 DIAGNOSIS — Y929 Unspecified place or not applicable: Secondary | ICD-10-CM | POA: Insufficient documentation

## 2012-11-07 DIAGNOSIS — F17200 Nicotine dependence, unspecified, uncomplicated: Secondary | ICD-10-CM | POA: Insufficient documentation

## 2012-11-07 DIAGNOSIS — Z91040 Latex allergy status: Secondary | ICD-10-CM | POA: Insufficient documentation

## 2012-11-07 MED ORDER — TRAMADOL HCL 50 MG PO TABS: 50.0000 mg | ORAL_TABLET | Freq: Four times a day (QID) | ORAL | Status: DC | PRN

## 2012-11-07 MED ORDER — CYCLOBENZAPRINE HCL 10 MG PO TABS: 10.0000 mg | ORAL_TABLET | Freq: Two times a day (BID) | ORAL | Status: DC | PRN

## 2012-11-07 MED ORDER — NAPROXEN 500 MG PO TABS: 500.0000 mg | ORAL_TABLET | Freq: Two times a day (BID) | ORAL | Status: DC

## 2012-11-07 NOTE — ED Notes (Signed)
Pt c/o right lower back pain since lifting something heavy yesterday evening.

## 2012-11-07 NOTE — ED Provider Notes (Signed)
History     CSN: [id number]  Arrival date & time 11/07/12  1330   First MD Initiated Contact with Patient 11/07/12 1430      Chief Complaint  Patient presents with  . Back Pain    (Consider location/radiation/quality/duration/timing/severity/associated sxs/prior treatment) Patient is a 27 y.o. female presenting with back pain. The history is provided by the patient.  Back Pain Location:  Lumbar spine Quality:  Aching Radiates to:  Does not radiate Pain severity:  Mild Pain is:  Same all the time Onset quality:  Sudden Duration: Yesterday. Timing:  Constant Progression:  Unchanged Chronicity:  New Context: lifting heavy objects and twisting   Context comment:  Patient states that she felt a sharp pain to her lower back after bending over to pick up a cooler Relieved by:  Nothing Worsened by:  Bending, twisting and standing Ineffective treatments:  None tried Associated symptoms: no abdominal pain, no abdominal swelling, no bladder incontinence, no bowel incontinence, no chest pain, no dysuria, no fever, no headaches, no leg pain, no numbness, no paresthesias, no pelvic pain, no perianal numbness, no tingling and no weakness     Past Medical History  Diagnosis Date  . Panic attack     History reviewed. No pertinent past surgical history.  No family history on file.  History  Substance Use Topics  . Smoking status: Current Every Day Smoker -- 0.50 packs/day  . Smokeless tobacco: Not on file  . Alcohol Use: No    OB History   Grav Para Term Preterm Abortions TAB SAB Ect Mult Living                  Review of Systems  Constitutional: Negative for fever.  Respiratory: Negative for shortness of breath.   Cardiovascular: Negative for chest pain.  Gastrointestinal: Negative for vomiting, abdominal pain, constipation and bowel incontinence.  Genitourinary: Negative for bladder incontinence, dysuria, hematuria, flank pain, decreased urine volume, difficulty  urinating and pelvic pain.       No perineal numbness or incontinence of urine or feces  Musculoskeletal: Positive for back pain. Negative for joint swelling.  Skin: Negative for rash.  Neurological: Negative for tingling, weakness, numbness, headaches and paresthesias.  All other systems reviewed and are negative.    Allergies  Latex  Home Medications   Current Outpatient Rx  Name  Route  Sig  Dispense  Refill  . cyclobenzaprine (FLEXERIL) 10 MG tablet   Oral   Take 1 tablet (10 mg total) by mouth 2 (two) times daily as needed for muscle spasms.   20 tablet   0   . naproxen (NAPROSYN) 500 MG tablet   Oral   Take 1 tablet (500 mg total) by mouth 2 (two) times daily.   20 tablet   0   . traMADol (ULTRAM) 50 MG tablet   Oral   Take 1 tablet (50 mg total) by mouth every 6 (six) hours as needed for pain.   20 tablet   0     BP 103/78  Pulse 99  Temp(Src) 98.8 F (37.1 C) (Oral)  Resp 18  Ht 5\' 6"  (1.676 m)  Wt 141 lb (63.957 kg)  BMI 22.77 kg/m2  SpO2 100%  Physical Exam  Nursing note and vitals reviewed. Constitutional: She is oriented to person, place, and time. She appears well-developed and well-nourished. No distress.  HENT:  Head: Normocephalic and atraumatic.  Neck: Normal range of motion. Neck supple.  Cardiovascular: Normal rate, regular rhythm,  normal heart sounds and intact distal pulses.   No murmur heard. Pulmonary/Chest: Effort normal and breath sounds normal. No respiratory distress.  Musculoskeletal: She exhibits tenderness. She exhibits no edema.       Lumbar back: She exhibits tenderness and pain. She exhibits normal range of motion, no bony tenderness, no swelling, no deformity, no laceration and normal pulse.       Back:  ttp of the lumbar paraspinal muscles.  No spinal tenderness.  DP pulses are brisk and symmetrical.  Distal sensation intact.  Hip Flexors/Extensors are intact  Neurological: She is alert and oriented to person, place, and  time. No cranial nerve deficit or sensory deficit. She exhibits normal muscle tone. Coordination and gait normal.  Reflex Scores:      Patellar reflexes are 2+ on the right side and 2+ on the left side.      Achilles reflexes are 2+ on the right side and 2+ on the left side. Skin: Skin is warm and dry.    ED Course  Procedures (including critical care time)  Labs Reviewed - No data to display No results found.   1. Lumbar strain, initial encounter       MDM    Patient has ttp of the lumbar paraspinal muscles.  No focal neuro deficits on exam.  Ambulates with a steady gait. Doubt emergent neurological or infectious process. Likely musculoskeletal strain.   Patient agrees to alternate ice and heat to her back, avoid twisting or heavy lifting. I will prescribe Flexeril, Ultram, and Naprosyn for pain. She agrees to followup with her primary care physician if symptoms are not improving. Vital signs are stable, she appears stable for discharge at the time.     Samera Macy L. Ruford Dudzinski, PA-C 11/07/12 1501

## 2012-11-08 NOTE — ED Provider Notes (Signed)
Medical screening examination/treatment/procedure(s) were performed by non-physician practitioner and as supervising physician I was immediately available for consultation/collaboration.   Page Pucciarelli L Leeasia Secrist, MD 11/08/12 0955 

## 2013-01-17 ENCOUNTER — Encounter (HOSPITAL_COMMUNITY): Payer: Self-pay | Admitting: Emergency Medicine

## 2013-01-17 ENCOUNTER — Emergency Department (HOSPITAL_COMMUNITY)
Admission: EM | Admit: 2013-01-17 | Discharge: 2013-01-17 | Disposition: A | Discharge status: Home / Self Care | Payer: Medicaid Other | Attending: Emergency Medicine | Admitting: Emergency Medicine

## 2013-01-17 DIAGNOSIS — Z91040 Latex allergy status: Secondary | ICD-10-CM | POA: Insufficient documentation

## 2013-01-17 DIAGNOSIS — F17200 Nicotine dependence, unspecified, uncomplicated: Secondary | ICD-10-CM | POA: Insufficient documentation

## 2013-01-17 DIAGNOSIS — F410 Panic disorder [episodic paroxysmal anxiety] without agoraphobia: Secondary | ICD-10-CM | POA: Insufficient documentation

## 2013-01-17 DIAGNOSIS — Z791 Long term (current) use of non-steroidal anti-inflammatories (NSAID): Secondary | ICD-10-CM | POA: Insufficient documentation

## 2013-01-17 DIAGNOSIS — M436 Torticollis: Secondary | ICD-10-CM

## 2013-01-17 MED ORDER — NAPROXEN 500 MG PO TABS: 500.0000 mg | ORAL_TABLET | Freq: Two times a day (BID) | ORAL | Status: DC

## 2013-01-17 MED ORDER — CYCLOBENZAPRINE HCL 5 MG PO TABS: 5.0000 mg | ORAL_TABLET | Freq: Three times a day (TID) | ORAL | Status: DC | PRN
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
## 2013-01-17 NOTE — ED Notes (Signed)
MD at bedside. 

## 2013-01-17 NOTE — ED Notes (Signed)
Pt in [facility] Friday and c/o left lateral neck pain. Pain a4 at this time but if turns to left then pain increases. Nad. Has not taken anything at home for pain. Denies pain to c spine with palpation. No s/s of pain at this time.

## 2013-02-01 ENCOUNTER — Encounter (HOSPITAL_COMMUNITY): Payer: Self-pay | Admitting: *Deleted

## 2013-02-01 ENCOUNTER — Emergency Department (HOSPITAL_COMMUNITY)
Admission: EM | Admit: 2013-02-01 | Discharge: 2013-02-01 | Disposition: A | Discharge status: Home / Self Care | Payer: Medicaid Other | Attending: Emergency Medicine | Admitting: Emergency Medicine

## 2013-02-01 ENCOUNTER — Emergency Department (HOSPITAL_COMMUNITY): Payer: Medicaid Other

## 2013-02-01 DIAGNOSIS — Z8659 Personal history of other mental and behavioral disorders: Secondary | ICD-10-CM | POA: Insufficient documentation

## 2013-02-01 DIAGNOSIS — M25461 Effusion, right knee: Secondary | ICD-10-CM

## 2013-02-01 DIAGNOSIS — M25469 Effusion, unspecified knee: Secondary | ICD-10-CM | POA: Insufficient documentation

## 2013-02-01 DIAGNOSIS — Z91040 Latex allergy status: Secondary | ICD-10-CM | POA: Insufficient documentation

## 2013-02-01 DIAGNOSIS — F17200 Nicotine dependence, unspecified, uncomplicated: Secondary | ICD-10-CM | POA: Insufficient documentation

## 2013-02-01 MED ORDER — NAPROXEN 500 MG PO TABS: 500.0000 mg | ORAL_TABLET | Freq: Two times a day (BID) | ORAL | Status: DC

## 2013-02-01 MED ORDER — NAPROXEN 250 MG PO TABS
500.0000 mg | ORAL_TABLET | Freq: Once | ORAL | Status: AC
  Administered 2013-02-01: 500 mg via ORAL
  Filled 2013-02-01: qty 2

## 2013-02-01 NOTE — ED Notes (Signed)
X-ray discontinued per Dr. Hyacinth Meeker.

## 2013-02-01 NOTE — ED Notes (Signed)
Reports woke up with pain to right knee this morning.  Denies injury.

## 2013-02-01 NOTE — ED Notes (Signed)
Pt refused arthrocentesis.  edp notified.
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
## 2013-08-22 ENCOUNTER — Other Ambulatory Visit: Payer: Self-pay

## 2013-08-22 ENCOUNTER — Emergency Department (HOSPITAL_COMMUNITY)
Admission: EM | Admit: 2013-08-22 | Discharge: 2013-08-22 | Disposition: A | Discharge status: Home / Self Care | Payer: Medicaid Other | Attending: Emergency Medicine | Admitting: Emergency Medicine

## 2013-08-22 ENCOUNTER — Encounter (HOSPITAL_COMMUNITY): Payer: Self-pay | Admitting: Emergency Medicine

## 2013-08-22 DIAGNOSIS — Z91040 Latex allergy status: Secondary | ICD-10-CM | POA: Insufficient documentation

## 2013-08-22 DIAGNOSIS — F410 Panic disorder [episodic paroxysmal anxiety] without agoraphobia: Secondary | ICD-10-CM | POA: Insufficient documentation

## 2013-08-22 DIAGNOSIS — F17200 Nicotine dependence, unspecified, uncomplicated: Secondary | ICD-10-CM | POA: Insufficient documentation

## 2013-08-22 NOTE — ED Notes (Signed)
Pt reports anxiety with "fast heart beat" pta after a disturbing phone call.  Has had anxiety in past and received medication with only 1 dose, No Rx.
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
## 2013-08-22 NOTE — ED Provider Notes (Signed)
CSN: [id number]     Arrival date & time 08/22/13  1522 History   First MD Initiated Contact with Patient 08/22/13 1536     Chief Complaint  Patient presents with  . Anxiety     (Consider location/radiation/quality/duration/timing/severity/associated sxs/prior Treatment) Patient is a 28 y.o. female presenting with anxiety. The history is provided by the patient.  Anxiety This is a recurrent problem. The current episode started today. The problem occurs intermittently. The problem has been gradually improving. Associated symptoms include chest pain. Pertinent negatives include no abdominal pain, anorexia, arthralgias, congestion, diaphoresis, fatigue, fever, headaches, joint swelling, nausea, neck pain, numbness, sore throat, visual change, vomiting or weakness. The symptoms are aggravated by stress. She has tried nothing for the symptoms. The treatment provided no relief.   Patient reports hx of recurrent panic attacks and states that  She had a sudden onset of felling nervous, heart racing and chest pain that began minutes after someone told her some upsetting news about a family member.  She states the symptoms lasted several minutes and began to improve by the time she reached the ED.  She denies shortness of breath, fever, recent illness or abdominal pain. She reports similar symptoms previously, but has not been started on any anti-anxiety medications     Past Medical History  Diagnosis Date  . Panic attack    History reviewed. No pertinent past surgical history. History reviewed. No pertinent family history. History  Substance Use Topics  . Smoking status: Current Every Day Smoker -- 0.50 packs/day    Types: Cigarettes  . Smokeless tobacco: Not on file  . Alcohol Use: Yes     Comment: occasional   OB History   Grav Para Term Preterm Abortions TAB SAB Ect Mult Living                 Review of Systems  Constitutional: Negative for fever, diaphoresis and fatigue.  HENT:  Negative for congestion, sore throat and trouble swallowing.   Respiratory: Positive for chest tightness. Negative for shortness of breath and wheezing.   Cardiovascular: Positive for chest pain. Negative for palpitations and leg swelling.  Gastrointestinal: Negative for nausea, vomiting, abdominal pain and anorexia.  Musculoskeletal: Negative for arthralgias, joint swelling and neck pain.  Neurological: Negative for dizziness, weakness, numbness and headaches.  Psychiatric/Behavioral: Negative for confusion. The patient is nervous/anxious.   All other systems reviewed and are negative.      Allergies  Latex  Home Medications  No current outpatient prescriptions on file. BP 112/80  Pulse 89  Temp(Src) 98.3 F (36.8 C) (Oral)  Resp 18  Ht 5\' 6"  (1.676 m)  Wt 135 lb (61.236 kg)  BMI 21.80 kg/m2  SpO2 100%  LMP 08/22/2013 Physical Exam  Nursing note and vitals reviewed. Constitutional: She is oriented to person, place, and time. She appears well-developed and well-nourished. No distress.  Patient appears calm.  HENT:  Head: Normocephalic and atraumatic.  Eyes: Conjunctivae and EOM are normal. Pupils are equal, round, and reactive to light.  Neck: Normal range of motion. Neck supple.  Cardiovascular: Normal rate, regular rhythm, normal heart sounds and intact distal pulses.   No murmur heard. Pulmonary/Chest: Effort normal and breath sounds normal. No respiratory distress. She has no wheezes. She has no rales. She exhibits no tenderness.  Abdominal: Soft. She exhibits no distension and no mass. There is no tenderness. There is no rebound and no guarding.  Musculoskeletal: Normal range of motion.  Neurological: She is alert and  oriented to person, place, and time. She exhibits normal muscle tone. Coordination normal.  Skin: Skin is warm and dry.  Psychiatric: She has a normal mood and affect. Her behavior is normal. Thought content normal.    ED Course  Procedures (including  critical care time) Labs Review Labs Reviewed - No data to display Imaging Review No results found.   Date: 08/22/2013  Rate: 70  Rhythm: normal sinus rhythm  QRS Axis: normal  Intervals: normal  ST/T Wave abnormalities: normal  Conduction Disutrbances:none  Narrative Interpretation:   Old EKG Reviewed: none available   EKG reviewed by Dr. Deretha Emory    MDM   Final diagnoses:  Panic attack    VSS.  Patient is well appearing, does not appear anxious at this time and states her symptoms have improved prior to ED arrival. Declines medication at this time.  No concerning sx's for ACS or PE.  , Well PE score is 0  She appears stable for discharge and agrees to f/u with her PMD regarding her recurrent anxiety.  Appears stable for discharge.    Shaw Dobek L. Trisha Mangle, PA-C 08/23/13 2035

## 2013-08-22 NOTE — Discharge Instructions (Signed)
Panic Attacks  Panic attacks are sudden, short-lived surges of severe anxiety, fear, or discomfort. They may occur for no reason when you are relaxed, when you are anxious, or when you are sleeping. Panic attacks may occur for a number of reasons:   · Healthy people occasionally have panic attacks in extreme, life-threatening situations, such as war or natural disasters. Normal anxiety is a protective mechanism of the body that helps us react to danger (fight or flight response).  · Panic attacks are often seen with anxiety disorders, such as panic disorder, social anxiety disorder, generalized anxiety disorder, and phobias. Anxiety disorders cause excessive or uncontrollable anxiety. They may interfere with your relationships or other life activities.  · Panic attacks are sometimes seen with other mental illnesses such as depression and posttraumatic stress disorder.  · Certain medical conditions, prescription medicines, and drugs of abuse can cause panic attacks.  SYMPTOMS   Panic attacks start suddenly, peak within 20 minutes, and are accompanied by four or more of the following symptoms:  · Pounding heart or fast heart rate (palpitations).  · Sweating.  · Trembling or shaking.  · Shortness of breath or feeling smothered.  · Feeling choked.  · Chest pain or discomfort.  · Nausea or strange feeling in your stomach.  · Dizziness, lightheadedness, or feeling like you will faint.  · Chills or hot flushes.  · Numbness or tingling in your lips or hands and feet.  · Feeling that things are not real or feeling that you are not yourself.  · Fear of losing control or going crazy.  · Fear of dying.  Some of these symptoms can mimic serious medical conditions. For example, you may think you are having a heart attack. Although panic attacks can be very scary, they are not life threatening.  DIAGNOSIS   Panic attacks are diagnosed through an assessment by your health care provider. Your health care provider will ask questions  about your symptoms, such as where and when they occurred. Your health care provider will also ask about your medical history and use of alcohol and drugs, including prescription medicines. Your health care provider may order blood tests or other studies to rule out a serious medical condition. Your health care provider may refer you to a mental health professional for further evaluation.  TREATMENT   · Most healthy people who have one or two panic attacks in an extreme, life-threatening situation will not require treatment.  · The treatment for panic attacks associated with anxiety disorders or other mental illness typically involves counseling with a mental health professional, medicine, or a combination of both. Your health care provider will help determine what treatment is best for you.  · Panic attacks due to physical illness usually goes away with treatment of the illness. If prescription medicine is causing panic attacks, talk with your health care provider about stopping the medicine, decreasing the dose, or substituting another medicine.  · Panic attacks due to alcohol or drug abuse goes away with abstinence. Some adults need professional help in order to stop drinking or using drugs.  HOME CARE INSTRUCTIONS   · Take all your medicines as prescribed.    · Check with your health care provider before starting new prescription or over-the-counter medicines.  · Keep all follow up appointments with your health care provider.  SEEK MEDICAL CARE IF:  · You are not able to take your medicines as prescribed.  · Your symptoms do not improve or get worse.  SEEK IMMEDIATE   MEDICAL CARE IF:   · You experience panic attack symptoms that are different than your usual symptoms.  · You have serious thoughts about hurting yourself or others.  · You are taking medicine for panic attacks and have a serious side effect.  MAKE SURE YOU:  · Understand these instructions.  · Will watch your condition.  · Will get help right away  if you are not doing well or get worse.  Document Released: 05/16/2005 Document Revised: 03/06/2013 Document Reviewed: 12/28/2012  Exit[employer]® Patient Information ©2014 [employer].

## 2013-08-26 NOTE — ED Provider Notes (Signed)
Medical screening examination/treatment/procedure(s) were performed by non-physician practitioner and as supervising physician I was immediately available for consultation/collaboration.   EKG Interpretation   Date/Time:  Thursday August 22 2013 15:58:33 EDT Ventricular Rate:  70 PR Interval:  178 QRS Duration: 72 QT Interval:  386 QTC Calculation: 416 R Axis:   45 Text Interpretation:  Normal sinus rhythm Normal ECG When compared with  ECG of 22-Aug-2013 15:58, No significant change was found ED PHYSICIAN  INTERPRETATION AVAILABLE IN [facility] Confirmed by TEST, Record  ([id number]) on 08/24/2013 1:45:44 PM        Shelda JakesScott W. Darrin Koman, MD 08/26/13 [phone]

## 2014-06-20 ENCOUNTER — Encounter: Payer: Medicaid Other | Admitting: Obstetrics & Gynecology

## 2014-07-07 ENCOUNTER — Encounter: Payer: Self-pay | Admitting: Obstetrics & Gynecology

## 2014-07-07 ENCOUNTER — Ambulatory Visit (INDEPENDENT_AMBULATORY_CARE_PROVIDER_SITE_OTHER): Payer: Medicaid Other | Admitting: Obstetrics & Gynecology

## 2014-07-07 ENCOUNTER — Other Ambulatory Visit (HOSPITAL_COMMUNITY)
Admission: RE | Admit: 2014-07-07 | Discharge: 2014-07-07 | Disposition: A | Discharge status: Home / Self Care | Payer: Medicaid Other | Source: Ambulatory Visit | Attending: Obstetrics & Gynecology | Admitting: Obstetrics & Gynecology

## 2014-07-07 VITALS — BP 121/75 | HR 87 | Ht 66.0 in | Wt 153.5 lb

## 2014-07-07 DIAGNOSIS — IMO0002 Reserved for concepts with insufficient information to code with codable children: Secondary | ICD-10-CM

## 2014-07-07 DIAGNOSIS — Z01812 Encounter for preprocedural laboratory examination: Secondary | ICD-10-CM

## 2014-07-07 DIAGNOSIS — R87619 Unspecified abnormal cytological findings in specimens from cervix uteri: Secondary | ICD-10-CM | POA: Diagnosis not present

## 2014-07-07 DIAGNOSIS — R896 Abnormal cytological findings in specimens from other organs, systems and tissues: Secondary | ICD-10-CM

## 2014-07-07 LAB — POCT PREGNANCY, URINE: Preg Test, Ur: NEGATIVE

## 2014-07-07 NOTE — Progress Notes (Signed)
Patient ID: Lisa Hanna, female   DOB: 04/19/1986, 29 y.o.   MRN: [id number]  Chief Complaint  Patient presents with  . Colposcopy    HPI Lisa Hanna is a 29 y.o. female.  [id number] No LMP recorded. Patient has had an injection. 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 Previous [place] years ago" [place]. Prior cervical treatment: LLETZ.  Past Medical History  Diagnosis Date  . Panic attack   . Vaginal Pap smear, abnormal     Past Surgical History  Procedure Laterality Date  . Hand surgery    . Leep      History reviewed. No pertinent family history.  Social History History  Substance Use Topics  . Smoking status: Current Every Day Smoker -- 0.50 packs/day    Types: Cigarettes  . Smokeless tobacco: Not on file  . Alcohol Use: Yes     Comment: occasional    Allergies  Allergen Reactions  . Latex Dermatitis    Current Outpatient Prescriptions  Medication Sig Dispense Refill  . medroxyPROGESTERone (DEPO-PROVERA) 150 MG/ML injection Inject 150 mg into the muscle every 3 (three) months.     No current facility-administered medications for this visit.    Review of Systems Review of Systems  Blood pressure 121/75, pulse 87, height 5\' 6"  (1.676 m), weight 153 lb 8 oz (69.627 kg).  Physical Exam Physical Exam  Data Reviewed Pap result  Assessment    Procedure Details  The risks and benefits of the procedure and Written informed consent obtained.  Speculum placed in vagina and excellent visualization of cervix achieved, cervix swabbed x 3 with acetic acid solution.  Specimens: ECC, Bx at 3, 9 and 11, scar from LEEP seen, SCJ seen, no AWE  Complications: none.     Plan    Specimens labelled and sent to Pathology. Return to discuss Pathology results in 2 weeks.      ARNOLD,JAMES 07/07/2014, 4:02  PM

## 2014-07-07 NOTE — Patient Instructions (Signed)
Colposcopy Colposcopy is a procedure to examine your cervix and vagina, or the area around the outside of your vagina, for abnormalities or signs of disease. The procedure is done using a lighted microscope called a colposcope. Tissue samples may be collected during the colposcopy if your health care provider finds any unusual cells. A colposcopy may be done if a woman has:  An abnormal Pap test. A Pap test is a medical test done to evaluate cells that are on the surface of the cervix.  A Pap test result that is suggestive of human papillomavirus (HPV). This virus can cause genital warts and is linked to the development of cervical cancer.  A sore on her cervix and the results of a Pap test were normal.  Genital warts on the cervix or in or around the outside of the vagina.  A mother who took the drug diethylstilbestrol (DES) while pregnant.  Painful intercourse.  Vaginal bleeding, especially after sexual intercourse. LET YOUR HEALTH CARE PROVIDER KNOW ABOUT:  Any allergies you have.  All medicines you are taking, including vitamins, herbs, eye drops, creams, and over-the-counter medicines.  Previous problems you or members of your family have had with the use of anesthetics.  Any blood disorders you have.  Previous surgeries you have had.  Medical conditions you have. RISKS AND COMPLICATIONS Generally, a colposcopy is a safe procedure. However, as with any procedure, complications can occur. Possible complications include:  Bleeding.  Infection.  Missed lesions. BEFORE THE PROCEDURE   Tell your health care provider if you have your menstrual period. A colposcopy typically is not done during menstruation.  For 24 hours before the colposcopy, do not:  [place].  Use tampons.  Use medicines, creams, or suppositories in the vagina.  Have sexual intercourse. PROCEDURE  During the procedure, you will be lying on your back with your feet in foot rests (stirrups). A warm  metal or plastic instrument (speculum) will be placed in your vagina to keep it open and to allow the health care provider to see the cervix. The colposcope will be placed outside the vagina. It will be used to magnify and examine the cervix, vagina, and the area around the outside of the vagina. A small amount of liquid solution will be placed on the area that is to be viewed. This solution will make it easier to see the abnormal cells. Your health care provider will use tools to suck out mucus and cells from the canal of the cervix. Then he or she will record the location of the abnormal areas. If a biopsy is done during the procedure, a medicine will usually be given to numb the area (local anesthetic). You may feel mild pain or cramping while the biopsy is done. After the procedure, tissue samples collected during the biopsy will be sent to a lab for analysis. AFTER THE PROCEDURE  You will be given instructions on when to follow up with your health care provider for your test results. It is important to keep your appointment. Document Released: 08/06/2002 Document Revised: 01/16/2013 Document Reviewed: 12/13/2012 Exit[employer] Patient Information 2015 [employer]. This information is not intended to replace advice given to you by your health care provider. Make sure you discuss any questions you have with your health care provider.  

## 2014-07-08 DIAGNOSIS — R87619 Unspecified abnormal cytological findings in specimens from cervix uteri: Secondary | ICD-10-CM | POA: Diagnosis not present

## 2014-07-17 ENCOUNTER — Encounter: Payer: Self-pay | Admitting: *Deleted

## 2014-07-21 ENCOUNTER — Ambulatory Visit: Payer: Medicaid Other | Admitting: Obstetrics & Gynecology

## 2014-08-04 ENCOUNTER — Encounter: Payer: Self-pay | Admitting: *Deleted

## 2014-08-06 ENCOUNTER — Ambulatory Visit: Payer: Medicaid Other | Admitting: Obstetrics & Gynecology

## 2015-08-07 ENCOUNTER — Emergency Department (HOSPITAL_COMMUNITY)
Admission: EM | Admit: 2015-08-07 | Discharge: 2015-08-07 | Disposition: A | Discharge status: Home / Self Care | Payer: Medicaid Other | Attending: Emergency Medicine | Admitting: Emergency Medicine

## 2015-08-07 ENCOUNTER — Encounter (HOSPITAL_COMMUNITY): Payer: Self-pay

## 2015-08-07 ENCOUNTER — Emergency Department (HOSPITAL_COMMUNITY): Payer: Medicaid Other

## 2015-08-07 DIAGNOSIS — R0789 Other chest pain: Secondary | ICD-10-CM | POA: Insufficient documentation

## 2015-08-07 DIAGNOSIS — F17210 Nicotine dependence, cigarettes, uncomplicated: Secondary | ICD-10-CM | POA: Insufficient documentation

## 2015-08-07 DIAGNOSIS — F410 Panic disorder [episodic paroxysmal anxiety] without agoraphobia: Secondary | ICD-10-CM | POA: Diagnosis not present

## 2015-08-07 DIAGNOSIS — Z79899 Other long term (current) drug therapy: Secondary | ICD-10-CM | POA: Insufficient documentation

## 2015-08-07 LAB — CBC WITH DIFFERENTIAL/PLATELET
BASOS ABS: 0 10*3/uL (ref 0.0–0.1)
Basophils Relative: 1 %
Eosinophils Absolute: 0.1 10*3/uL (ref 0.0–0.7)
Eosinophils Relative: 1 %
HEMATOCRIT: 39.3 % (ref 36.0–46.0)
HEMOGLOBIN: 12.9 g/dL (ref 12.0–15.0)
LYMPHS PCT: 23 %
Lymphs Abs: 2 10*3/uL (ref 0.7–4.0)
MCH: 34.3 pg — ABNORMAL HIGH (ref 26.0–34.0)
MCHC: 32.8 g/dL (ref 30.0–36.0)
MCV: 104.5 fL — AB (ref 78.0–100.0)
Monocytes Absolute: 0.7 10*3/uL (ref 0.1–1.0)
Monocytes Relative: 8 %
NEUTROS ABS: 5.9 10*3/uL (ref 1.7–7.7)
Neutrophils Relative %: 67 %
Platelets: 302 10*3/uL (ref 150–400)
RBC: 3.76 MIL/uL — AB (ref 3.87–5.11)
RDW: 12.2 % (ref 11.5–15.5)
WBC: 8.7 10*3/uL (ref 4.0–10.5)

## 2015-08-07 LAB — BASIC METABOLIC PANEL
ANION GAP: 7 (ref 5–15)
BUN: 9 mg/dL (ref 6–20)
CO2: 24 mmol/L (ref 22–32)
Calcium: 8.4 mg/dL — ABNORMAL LOW (ref 8.9–10.3)
Chloride: 106 mmol/L (ref 101–111)
Creatinine, Ser: 0.78 mg/dL (ref 0.44–1.00)
GFR calc Af Amer: >60 mL/min (ref >60)
GFR calc non Af Amer: >60 mL/min (ref >60)
GLUCOSE: 88 mg/dL (ref 65–99)
POTASSIUM: 3.3 mmol/L — AB (ref 3.5–5.1)
Sodium: 137 mmol/L (ref 135–145)

## 2015-08-07 LAB — POC URINE PREG, ED: PREG TEST UR: NEGATIVE

## 2015-08-07 LAB — D-DIMER, QUANTITATIVE: D-Dimer, Quant: 0.33 ug/mL-FEU (ref 0.00–0.50)

## 2015-08-07 LAB — TROPONIN I: Troponin I: <0.03 ng/mL (ref <0.031)

## 2015-08-07 MED ORDER — IBUPROFEN 800 MG PO TABS
800.0000 mg | ORAL_TABLET | Freq: Once | ORAL | Status: AC
  Administered 2015-08-07: 800 mg via ORAL
  Filled 2015-08-07: qty 1

## 2015-08-07 MED ORDER — POTASSIUM CHLORIDE CRYS ER 20 MEQ PO TBCR
40.0000 meq | EXTENDED_RELEASE_TABLET | Freq: Once | ORAL | Status: AC
  Administered 2015-08-07: 40 meq via ORAL
  Filled 2015-08-07: qty 2

## 2015-08-07 MED ORDER — IBUPROFEN 800 MG PO TABS: 800.0000 mg | ORAL_TABLET | Freq: Three times a day (TID) | ORAL | Status: DC | PRN

## 2015-08-07 NOTE — Discharge Instructions (Signed)
Chest Wall Pain °Chest wall pain is pain in or around the bones and muscles of your chest. Sometimes, an injury causes this pain. Sometimes, the cause may not be known. This pain may take several weeks or longer to get better. °HOME CARE INSTRUCTIONS  °Pay attention to any changes in your symptoms. Take these actions to help with your pain:  °· Rest as told by your health care provider.   °· Avoid activities that cause pain. These include any activities that use your chest muscles or your abdominal and side muscles to lift heavy items.    °· If directed, apply ice to the painful area: °· Put ice in a plastic bag. °· Place a towel between your skin and the bag. °· Leave the ice on for 20 minutes, 2-3 times per day. °· Take over-the-counter and prescription medicines only as told by your health care provider. °· Do not use tobacco products, including cigarettes, chewing tobacco, and e-cigarettes. If you need help quitting, ask your health care provider. °· Keep all follow-up visits as told by your health care provider. This is important. °SEEK MEDICAL CARE IF: °· You have a fever. °· Your chest pain becomes worse. °· You have new symptoms. °SEEK IMMEDIATE MEDICAL CARE IF: °· You have nausea or vomiting. °· You feel sweaty or light-headed. °· You have a cough with phlegm (sputum) or you cough up blood. °· You develop shortness of breath. °  °This information is not intended to replace advice given to you by your health care provider. Make sure you discuss any questions you have with your health care provider. °  °Document Released: 05/16/2005 Document Revised: 02/04/2015 Document Reviewed: 08/11/2014 °Elsevier Interactive Patient Education ©2016 [employer] ° °Panic Attacks °Panic attacks are sudden, short-lived surges of severe anxiety, fear, or discomfort. They may occur for no reason when you are relaxed, when you are anxious, or when you are sleeping. Panic attacks may occur for a number of reasons:  °· Healthy  people occasionally have panic attacks in extreme, life-threatening situations, such as war or natural disasters. Normal anxiety is a protective mechanism of the body that helps us react to danger (fight or flight response). °· Panic attacks are often seen with anxiety disorders, such as panic disorder, social anxiety disorder, generalized anxiety disorder, and phobias. Anxiety disorders cause excessive or uncontrollable anxiety. They may interfere with your relationships or other life activities. °· Panic attacks are sometimes seen with other mental illnesses, such as depression and posttraumatic stress disorder. °· Certain medical conditions, prescription medicines, and drugs of abuse can cause panic attacks. °SYMPTOMS  °Panic attacks start suddenly, peak within 20 minutes, and are accompanied by four or more of the following symptoms: °· Pounding heart or fast heart rate (palpitations). °· Sweating. °· Trembling or shaking. °· Shortness of breath or feeling smothered. °· Feeling choked. °· Chest pain or discomfort. °· Nausea or strange feeling in your stomach. °· Dizziness, light-headedness, or feeling like you will faint. °· Chills or hot flushes. °· Numbness or tingling in your lips or hands and feet. °· Feeling that things are not real or feeling that you are not yourself. °· Fear of losing control or going crazy. °· Fear of dying. °Some of these symptoms can mimic serious medical conditions. For example, you may think you are having a heart attack. Although panic attacks can be very scary, they are not life threatening. °DIAGNOSIS  °Panic attacks are diagnosed through an assessment by your health care provider. Your health care   provider will ask questions about your symptoms, such as where and when they occurred. Your health care provider will also ask about your medical history and use of alcohol and drugs, including prescription medicines. Your health care provider may order blood tests or other studies to  rule out a serious medical condition. Your health care provider may refer you to a mental health professional for further evaluation. °TREATMENT  °· Most healthy people who have one or two panic attacks in an extreme, life-threatening situation will not require treatment. °· The treatment for panic attacks associated with anxiety disorders or other mental illness typically involves counseling with a mental health professional, medicine, or a combination of both. Your health care provider will help determine what treatment is best for you. °· Panic attacks due to physical illness usually go away with treatment of the illness. If prescription medicine is causing panic attacks, talk with your health care provider about stopping the medicine, decreasing the dose, or substituting another medicine. °· Panic attacks due to alcohol or drug abuse go away with abstinence. Some adults need professional help in order to stop drinking or using drugs. °HOME CARE INSTRUCTIONS  °· Take all medicines as directed by your health care provider.   °· Schedule and attend follow-up visits as directed by your health care provider. It is important to keep all your appointments. °SEEK MEDICAL CARE IF: °· You are not able to take your medicines as prescribed. °· Your symptoms do not improve or get worse. °SEEK IMMEDIATE MEDICAL CARE IF:  °· You experience panic attack symptoms that are different than your usual symptoms. °· You have serious thoughts about hurting yourself or others. °· You are taking medicine for panic attacks and have a serious side effect. °MAKE SURE YOU: °· Understand these instructions. °· Will watch your condition. °· Will get help right away if you are not doing well or get worse. °  °This information is not intended to replace advice given to you by your health care provider. Make sure you discuss any questions you have with your health care provider. °  °Document Released: 05/16/2005 Document Revised: 05/21/2013  Document Reviewed: 12/28/2012 °Elsevier Interactive Patient Education ©2016 [employer]vier Inc. ° °

## 2015-08-07 NOTE — ED Provider Notes (Signed)
TIME SEEN: 2:25 AM  CHIEF COMPLAINT: Chest pain and shortness of breath, cramping in her fingers and toes  HPI: Pt is a 30 y.o. female with history of panic attacks who presents to the emergency department with complaints of left-sided sharp chest pain that started this evening with shortness of breath. Significant other bedside states the patient began hyperventilating and then complained of tightness, cramping in both of her hands and feet. States that they were "curling up". Reports she is feeling better but is still having chest pain. States it is worse with deep inspiration. States to me that this feels "nothing like my pain attack attacks". No vomiting or diarrhea. No history of premature CAD for herself or family. No history of PE or DVT. No lower extremity swelling or pain. Patient is on Depo-Provera and is a smoker.  ROS: See HPI Constitutional: no fever  Eyes: no drainage  ENT: no runny nose   Cardiovascular:   chest pain  Resp:  SOB  GI: no vomiting GU: no dysuria Integumentary: no rash  Allergy: no hives  Musculoskeletal: no leg swelling  Neurological: no slurred speech ROS otherwise negative  PAST MEDICAL HISTORY/PAST SURGICAL HISTORY:  Past Medical History  Diagnosis Date  . Panic attack   . Vaginal Pap smear, abnormal     MEDICATIONS:  Prior to Admission medications   Medication Sig Start Date End Date Taking? Authorizing Provider  medroxyPROGESTERone (DEPO-PROVERA) 150 MG/ML injection Inject 150 mg into the muscle every 3 (three) months.    Historical Provider, MD    ALLERGIES:  Allergies  Allergen Reactions  . Latex Dermatitis    SOCIAL HISTORY:  Social History  Substance Use Topics  . Smoking status: Current Every Day Smoker -- 0.50 packs/day    Types: Cigarettes  . Smokeless tobacco: Not on file  . Alcohol Use: Yes     Comment: occasional    FAMILY HISTORY: No family history on file.  EXAM: BP 127/86 mmHg  Pulse 109  Temp(Src) 97.8 F (36.6  C) (Oral)  Resp 22  Ht  (1.676 m)  Wt 164 lb (74.39 kg)  BMI 26.48 kg/m2  SpO2 100% CONSTITUTIONAL: Alert and oriented and responds appropriately to questions. Well-appearing; well-nourished HEAD: Normocephalic EYES: Conjunctivae clear, PERRL ENT: normal nose; no rhinorrhea; moist mucous membranes NECK: Supple, no meningismus, no LAD  CARD: Regular and tachycardic; S1 and S2 appreciated; no murmurs, no clicks, no rubs, no gallops CHEST:  Patient is mildly tender to palpation over the lower anterior left ribs without ecchymosis, crepitus, deformity, rash or other lesions RESP: Normal chest excursion without splinting or tachypnea; breath sounds clear and equal bilaterally; no wheezes, no rhonchi, no rales, no hypoxia or respiratory distress, speaking full sentences ABD/GI: Normal bowel sounds; non-distended; soft, non-tender, no rebound, no guarding, no peritoneal signs BACK:  The back appears normal and is non-tender to palpation, there is no CVA tenderness EXT: Normal ROM in all joints; non-tender to palpation; no edema; normal capillary refill; no cyanosis, no calf tenderness or swelling    SKIN: Normal color for age and race; warm; no rash NEURO: Moves all extremities equally, sensation to light touch intact diffusely, cranial nerves II through XII intact PSYCH: The patient's mood and manner are appropriate. Grooming and personal hygiene are appropriate.  MEDICAL DECISION MAKING: Patient here with chest pain and shortness of breath. She does have risk factors for PE and is mildly tachycardic. Pain is reproducible however with palpation of her chest and she does  have a history of panic attacks. Significant other reports she was hyperventilating a suspect this was causing the cramping, tingling in her hands and feet. Given she does have risk factors for PE, we will obtain labs but my suspicion for this is less I feel we can use d-dimer to rule PE out. Will give ibuprofen for her pain. She  no longer appears anxious on exam.  ED PROGRESS: Patient's labs are unremarkable other than potassium of 3.3 which will replace. Troponin is negative. D-dimer negative. Chest x-ray clear. Urine pregnancy test is negative.  Have discussed with patient I suspect that this is chest wall pain versus anxiety attack and I feel she is safe to be discharged home. Have provided her with outpatient resources. Have advised her to use ibuprofen as needed for her pain. Discussed return precautions. She verbalized understanding and is comfortable with this plan.    EKG Interpretation  Date/Time:  Friday August 07 2015 02:56:16 EST Ventricular Rate:  97 PR Interval:  162 QRS Duration: 77 QT Interval:  352 QTC Calculation: 447 R Axis:   47 Text Interpretation:  Sinus rhythm No significant change since last tracing Confirmed by Tayten Heber,  DO, Kimball Appleby [phone]([id number]) on 08/07/2015 3:07:45 AM        Layla MawKristen N Tabb Croghan, DO 08/07/15 0423

## 2015-08-07 NOTE — ED Notes (Signed)
Pt reportedly had sudden onset of a panic attack, has been hyperventilating and is now having tingling and tightness in her hands and fingers.  Pt also c/o pain in left ribcage.

## 2017-04-28 ENCOUNTER — Encounter (HOSPITAL_COMMUNITY): Payer: Self-pay | Admitting: Emergency Medicine

## 2017-04-28 ENCOUNTER — Emergency Department (HOSPITAL_COMMUNITY): Payer: Medicaid Other

## 2017-04-28 ENCOUNTER — Emergency Department (HOSPITAL_COMMUNITY)
Admission: EM | Admit: 2017-04-28 | Discharge: 2017-04-28 | Disposition: A | Discharge status: Home / Self Care | Payer: Medicaid Other | Attending: Emergency Medicine | Admitting: Emergency Medicine

## 2017-04-28 ENCOUNTER — Other Ambulatory Visit: Payer: Self-pay

## 2017-04-28 DIAGNOSIS — O200 Threatened abortion: Secondary | ICD-10-CM | POA: Diagnosis not present

## 2017-04-28 DIAGNOSIS — N939 Abnormal uterine and vaginal bleeding, unspecified: Secondary | ICD-10-CM | POA: Diagnosis present

## 2017-04-28 DIAGNOSIS — F17210 Nicotine dependence, cigarettes, uncomplicated: Secondary | ICD-10-CM | POA: Diagnosis not present

## 2017-04-28 DIAGNOSIS — Z3A00 Weeks of gestation of pregnancy not specified: Secondary | ICD-10-CM | POA: Insufficient documentation

## 2017-04-28 LAB — CBC
HCT: 41.5 % (ref 36.0–46.0)
Hemoglobin: 13.1 g/dL (ref 12.0–15.0)
MCH: 33.9 pg (ref 26.0–34.0)
MCHC: 31.6 g/dL (ref 30.0–36.0)
MCV: 107.5 fL — AB (ref 78.0–100.0)
Platelets: 333 10*3/uL (ref 150–400)
RBC: 3.86 MIL/uL — ABNORMAL LOW (ref 3.87–5.11)
RDW: 11.7 % (ref 11.5–15.5)
WBC: 6.9 10*3/uL (ref 4.0–10.5)

## 2017-04-28 LAB — ABO/RH: ABO/RH(D): O POS

## 2017-04-28 LAB — HCG, QUANTITATIVE, PREGNANCY: hCG, Beta Chain, Quant, S: 3224 m[IU]/mL — ABNORMAL HIGH (ref <5)

## 2017-04-28 NOTE — ED Provider Notes (Signed)
[facility] EMERGENCY DEPARTMENT Provider Note   CSN: [id number] Arrival date & time: 04/28/17  [id number]     History   Chief Complaint Chief Complaint  Patient presents with  . Threatened Miscarriage    HPI Lisa Hanna is a 31 y.o. female.  Patient presents with vaginal bleeding and abdominal cramping since this morning. Patient's had 2 live pregnancies and 2 plan miscarriages. Patient has no abdominal pain currently no fevers chills or vomiting. Patient has not had an ultrasound thus far.      Past Medical History:  Diagnosis Date  . Panic attack   . Vaginal Pap smear, abnormal     Patient Active Problem List   Diagnosis Date Noted  . 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 LSIL (low grade squamous intraepithelial lesion) on Pap smear 11/27/2010  . Status post LEEP (loop electrosurgical excision procedure) of cervix 11/27/2010    Past Surgical History:  Procedure Laterality Date  . HAND SURGERY    . LEEP      OB History    Gravida Para Term Preterm AB Living   6 2 2     2    SAB TAB Ectopic Multiple Live Births                   Home Medications    Prior to Admission medications   Medication Sig Start Date End Date Taking? Authorizing Provider  ibuprofen (ADVIL,MOTRIN) 800 MG tablet Take 1 tablet (800 mg total) by mouth every 8 (eight) hours as needed for mild pain. 08/07/15   Ward, Layla MawKristen N, DO  medroxyPROGESTERone (DEPO-PROVERA) 150 MG/ML injection Inject 150 mg into the muscle every 3 (three) months.    [provider]    Family History No family history on file.  Social History Social History   Tobacco Use  . Smoking status: Current Every Day Smoker    Packs/day: 0.50    Types: Cigarettes  . Smokeless tobacco: Never Used  Substance Use Topics  . Alcohol use: No    Frequency: Never    Comment: not since + pregnancy   . Drug use: No     Allergies   Latex   Review of Systems Review of Systems    Constitutional: Negative for chills and fever.  HENT: Negative for congestion.   Eyes: Negative for visual disturbance.  Respiratory: Negative for shortness of breath.   Cardiovascular: Negative for chest pain.  Gastrointestinal: Positive for abdominal pain. Negative for vomiting.  Genitourinary: Positive for vaginal bleeding. Negative for dysuria and flank pain.  Musculoskeletal: Negative for back pain, neck pain and neck stiffness.  Skin: Negative for rash.  Neurological: Negative for light-headedness and headaches.     Physical Exam Updated Vital Signs BP 113/78   Pulse 71   Temp 98.5 F (36.9 C) (Oral)   Resp 16   Ht 5\' 6"  (1.676 m)   Wt 83.9 kg (185 lb)   LMP 03/02/2017   SpO2 98%   BMI 29.86 kg/m   Physical Exam  Constitutional: She appears well-developed and well-nourished. No distress.  HENT:  Head: Normocephalic and atraumatic.  Eyes: Conjunctivae are normal.  Neck: Neck supple.  Cardiovascular: Normal rate and regular rhythm.  Pulmonary/Chest: Effort normal and breath sounds normal. No respiratory distress.  Abdominal: Soft. There is no tenderness.  Musculoskeletal: She exhibits no edema.  Neurological: She is alert.  Skin: Skin is warm and dry.  Psychiatric: She has a normal mood and affect.  Nursing note and vitals reviewed.    ED Treatments / Results  Labs (all labs ordered are listed, but only abnormal results are displayed) Labs Reviewed  CBC - Abnormal; Notable for the following components:      Result Value   RBC 3.86 (*)    MCV 107.5 (*)    All other components within normal limits  HCG, QUANTITATIVE, PREGNANCY  ABO/RH    EKG  EKG Interpretation None       Radiology US Ob Comp < 14 Wks  Result Date: 04/28/2017 CLINICAL DATA:  Vaginal bleeding. EXAM: OBSTETRIC <14 WK [place] AND TRANSVAGINAL OB US TECHNIQUE: Both transabdominal and transvaginal ultrasound examinations were performed for complete evaluation of the gestation as well as  the maternal uterus, adnexal regions, and pelvic cul-de-sac. Transvaginal technique was performed to assess early pregnancy. COMPARISON:  Ultrasound 05/14/2012. FINDINGS: Intrauterine gestational sac: None visualized Yolk sac:  None visualized Embryo:  None visualized Cardiac Activity: None visualized Subchorionic hemorrhage:  None visualized. Maternal uterus/adnexae: Endometrium measures 9 mm in thickness and is slightly heterogeneous, follow-up exam can be obtained to demonstrate resolution. 1.3 cm heterogeneous focus with noted within the right ovary, possibly corpus luteal cyst. Pregnancy test suggested to exclude ectopic pregnancy. Left ovary not visualized due to overlying bowel gas. Trace free pelvic fluid. IMPRESSION: 1. No intrauterine pregnancy noted. Findings are suspicious but not yet definitive for failed pregnancy. Recommend follow-up US in 10-14 days for definitive diagnosis. This recommendation follows SRU consensus guidelines: Diagnostic Criteria for Nonviable Pregnancy Early in the First Trimester. Malva Limes Med 2013; 161:0960-45. 2. Endometrium is slightly heterogeneous in appearance, follow-up exam can be obtained to demonstrate resolution. 1.3 cm heterogeneous focus in the right ovary, possibly corpus luteal cyst. Pregnancy test suggested to exclude ectopic pregnancy. This can also be followed with ultrasound to demonstrate resolution. Electronically Signed   By: Maisie Fus  Register   On: 04/28/2017 10:52   US Ob Transvaginal  Result Date: 04/28/2017 CLINICAL DATA:  Vaginal bleeding. EXAM: OBSTETRIC <14 WK [place] AND TRANSVAGINAL OB US TECHNIQUE: Both transabdominal and transvaginal ultrasound examinations were performed for complete evaluation of the gestation as well as the maternal uterus, adnexal regions, and pelvic cul-de-sac. Transvaginal technique was performed to assess early pregnancy. COMPARISON:  Ultrasound 05/14/2012. FINDINGS: Intrauterine gestational sac: None visualized Yolk sac:   None visualized Embryo:  None visualized Cardiac Activity: None visualized Subchorionic hemorrhage:  None visualized. Maternal uterus/adnexae: Endometrium measures 9 mm in thickness and is slightly heterogeneous, follow-up exam can be obtained to demonstrate resolution. 1.3 cm heterogeneous focus with noted within the right ovary, possibly corpus luteal cyst. Pregnancy test suggested to exclude ectopic pregnancy. Left ovary not visualized due to overlying bowel gas. Trace free pelvic fluid. IMPRESSION: 1. No intrauterine pregnancy noted. Findings are suspicious but not yet definitive for failed pregnancy. Recommend follow-up US in 10-14 days for definitive diagnosis. This recommendation follows SRU consensus guidelines: Diagnostic Criteria for Nonviable Pregnancy Early in the First Trimester. Malva Limes Med 2013; 409:8119-14. 2. Endometrium is slightly heterogeneous in appearance, follow-up exam can be obtained to demonstrate resolution. 1.3 cm heterogeneous focus in the right ovary, possibly corpus luteal cyst. Pregnancy test suggested to exclude ectopic pregnancy. This can also be followed with ultrasound to demonstrate resolution. Electronically Signed   By: Maisie Fus  Register   On: 04/28/2017 10:52    Procedures Procedures (including critical care time) EMERGENCY DEPARTMENT [place] PREGNANCY "Study: Limited Ultrasound of the Pelvis for Pregnancy"  INDICATIONS:Pregnancy(required) and Vaginal bleeding Multiple  views of the uterus and pelvic cavity were obtained in real-time with a multi-frequency probe.  APPROACH:Transabdominal  PERFORMED BY: Myself IMAGES ARCHIVED?: Yes LIMITATIONS: Body habitus INTERPRETATION: Unable to see details, gestational sac seen, no fetal pole     Medications Ordered in ED Medications - No data to display   Initial Impression / Assessment and Plan / ED Course  I have reviewed the triage vital signs and the nursing notes.  Pertinent labs & imaging results that were  available during my care of the patient were reviewed by me and considered in my medical decision making (see chart for details).    Patient with miscarriage history presents with vaginal bleeding, new pregnancy. Bedside ultrasound unable to see details of early pregnancy only gestational sac visualized. Formal ultrasound by radiology ordered. Patient well-appearing otherwise.  Formal ultrasound concerning for miscarriage. Patient will need repeat ultrasound and repeat blood work done with OB next week.  Final Clinical Impressions(s) / ED Diagnoses   Final diagnoses:  Threatened miscarriage    ED Discharge Orders    None       Blane OharaZavitz, Shaily Librizzi, MD 04/28/17 1129

## 2017-04-28 NOTE — ED Notes (Signed)
Patient transported to Ultrasound 

## 2017-04-28 NOTE — Discharge Instructions (Signed)
Follow-up closely with OB doctor Monday or Tuesday. Return for fevers, uncontrolled pain, passing out or other concerns. [facility].

## 2017-04-28 NOTE — ED Notes (Signed)
Pt returned from US

## 2017-04-28 NOTE — ED Triage Notes (Signed)
Pt approx [redacted] weeks pregnant c/o vaginal bleeding w/o pain that began this morning. [facility]. LMP 03/02/17.

## 2017-05-28 ENCOUNTER — Encounter (HOSPITAL_COMMUNITY): Payer: Self-pay | Admitting: Emergency Medicine

## 2017-05-28 ENCOUNTER — Other Ambulatory Visit: Payer: Self-pay

## 2017-05-28 DIAGNOSIS — R102 Pelvic and perineal pain: Secondary | ICD-10-CM | POA: Insufficient documentation

## 2017-05-28 DIAGNOSIS — F17210 Nicotine dependence, cigarettes, uncomplicated: Secondary | ICD-10-CM | POA: Diagnosis not present

## 2017-05-28 DIAGNOSIS — N939 Abnormal uterine and vaginal bleeding, unspecified: Secondary | ICD-10-CM | POA: Insufficient documentation

## 2017-05-28 NOTE — ED Triage Notes (Signed)
Pt states she was seen here about a month ago with vaginal bleeding, she states she was told she was having a miscarriage but she has not stopped bleeding since then, pt states [facility], pt states last night the bleeding has worsened and she is now passing clots

## 2017-05-29 ENCOUNTER — Emergency Department (HOSPITAL_COMMUNITY)
Admission: EM | Admit: 2017-05-29 | Discharge: 2017-05-29 | Disposition: A | Discharge status: Home / Self Care | Payer: Medicaid Other | Attending: Emergency Medicine | Admitting: Emergency Medicine

## 2017-05-29 DIAGNOSIS — N939 Abnormal uterine and vaginal bleeding, unspecified: Secondary | ICD-10-CM

## 2017-05-29 LAB — URINALYSIS, ROUTINE W REFLEX MICROSCOPIC
BACTERIA UA: NONE SEEN
Bilirubin Urine: NEGATIVE
Glucose, UA: NEGATIVE mg/dL
Ketones, ur: NEGATIVE mg/dL
LEUKOCYTES UA: NEGATIVE
Nitrite: NEGATIVE
PROTEIN: 30 mg/dL — AB
Specific Gravity, Urine: 1.005 (ref 1.005–1.030)
WBC UA: NONE SEEN WBC/hpf (ref 0–5)
pH: 7 (ref 5.0–8.0)

## 2017-05-29 LAB — BASIC METABOLIC PANEL
Anion gap: 9 (ref 5–15)
BUN: 7 mg/dL (ref 6–20)
CALCIUM: 8.9 mg/dL (ref 8.9–10.3)
CHLORIDE: 105 mmol/L (ref 101–111)
CO2: 25 mmol/L (ref 22–32)
CREATININE: 0.71 mg/dL (ref 0.44–1.00)
GFR calc Af Amer: >60 mL/min (ref >60)
GFR calc non Af Amer: >60 mL/min (ref >60)
Glucose, Bld: 125 mg/dL — ABNORMAL HIGH (ref 65–99)
Potassium: 3.3 mmol/L — ABNORMAL LOW (ref 3.5–5.1)
Sodium: 139 mmol/L (ref 135–145)

## 2017-05-29 LAB — CBC WITH DIFFERENTIAL/PLATELET
Basophils Absolute: 0 10*3/uL (ref 0.0–0.1)
Basophils Relative: 0 %
EOS ABS: 0.1 10*3/uL (ref 0.0–0.7)
Eosinophils Relative: 1 %
HEMATOCRIT: 38.4 % (ref 36.0–46.0)
HEMOGLOBIN: 12.2 g/dL (ref 12.0–15.0)
LYMPHS ABS: 2.5 10*3/uL (ref 0.7–4.0)
Lymphocytes Relative: 32 %
MCH: 33.5 pg (ref 26.0–34.0)
MCHC: 31.8 g/dL (ref 30.0–36.0)
MCV: 105.5 fL — ABNORMAL HIGH (ref 78.0–100.0)
MONO ABS: 0.3 10*3/uL (ref 0.1–1.0)
MONOS PCT: 4 %
NEUTROS ABS: 4.9 10*3/uL (ref 1.7–7.7)
Neutrophils Relative %: 63 %
Platelets: 332 10*3/uL (ref 150–400)
RBC: 3.64 MIL/uL — ABNORMAL LOW (ref 3.87–5.11)
RDW: 12 % (ref 11.5–15.5)
WBC: 7.9 10*3/uL (ref 4.0–10.5)

## 2017-05-29 LAB — WET PREP, GENITAL
Clue Cells Wet Prep HPF POC: NONE SEEN
Sperm: NONE SEEN
TRICH WET PREP: NONE SEEN
YEAST WET PREP: NONE SEEN

## 2017-05-29 LAB — PREGNANCY, URINE: PREG TEST UR: NEGATIVE

## 2017-05-29 LAB — ABO/RH: ABO/RH(D): O POS

## 2017-05-29 LAB — HCG, QUANTITATIVE, PREGNANCY: hCG, Beta Chain, Quant, S: 1 m[IU]/mL (ref <5)

## 2017-05-29 NOTE — Discharge Instructions (Signed)
You have completed your miscarriage.  Follow-up with the gynecologist.  Return to the ED if you develop worsening symptoms including pain or vaginal bleeding or any other concerns.

## 2017-05-29 NOTE — ED Provider Notes (Signed)
[facility]Y [facility]PARTMENT Provider Note   CSN: [id number] Arrival date & time: 05/28/17  2331     History   Chief Complaint Chief Complaint  Patient presents with  . Vaginal Bleeding    HPI Lisa Hanna is a 31 y.o. female.  Patient states she was diagnosed with a miscarriage about 1 month ago.  She has had constant vaginal bleeding since then.  About 3 pads worth per day.  Today she developed bleeding with 2 more clots that she became concerned about.  Bleeding has persisted.  States she has been seen by the health department and her hCG has been downtrending.  Has had intermittent lower pelvic cramping.  She denies any vomiting or fever.  Denies any chest pain or shortness of breath.  Denies any dizziness or lightheadedness.  She is not on any birth control.   The history is provided by the patient.  Vaginal Bleeding  Primary symptoms include pelvic pain, vaginal bleeding.  Primary symptoms include no dysuria. Associated symptoms include abdominal pain. Pertinent negatives include no constipation, no nausea, no vomiting and no dizziness.    Past Medical History:  Diagnosis Date  . Panic attack   . Vaginal Pap smear, abnormal     Patient Active Problem List   Diagnosis Date Noted  . 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 LSIL (low grade squamous intraepithelial lesion) on Pap smear 11/27/2010  . Status post LEEP (loop electrosurgical excision procedure) of cervix 11/27/2010    Past Surgical History:  Procedure Laterality Date  . HAND SURGERY    . LEEP      OB History    Gravida Para Term Preterm AB Living   6 2 2     2    SAB TAB Ectopic Multiple Live Births                   Home Medications    Prior to Admission medications   Medication Sig Start Date End Date Taking? Authorizing Provider  ibuprofen (ADVIL,MOTRIN) 800 MG tablet Take 1 tablet (800 mg total) by mouth every 8 (eight) hours as needed for mild pain.  08/07/15   Ward, Layla MawKristen N, DO  medroxyPROGESTERone (DEPO-PROVERA) 150 MG/ML injection Inject 150 mg into the muscle every 3 (three) months.    [provider]    Family History No family history on file.  Social History Social History   Tobacco Use  . Smoking status: Current Every Day Smoker    Packs/day: 0.50    Types: Cigarettes  . Smokeless tobacco: Never Used  Substance Use Topics  . Alcohol use: No    Frequency: Never    Comment: occassional  . Drug use: No     Allergies   Latex   Review of Systems Review of Systems  Constitutional: Negative for activity change, appetite change and fever.  HENT: Negative for congestion.   Respiratory: Negative for cough, chest tightness and shortness of breath.   Cardiovascular: Negative for chest pain.  Gastrointestinal: Positive for abdominal pain. Negative for constipation, nausea and vomiting.  Genitourinary: Positive for pelvic pain and vaginal bleeding. Negative for dysuria and hematuria.  Musculoskeletal: Negative for arthralgias and myalgias.  Skin: Negative for rash.  Neurological: Negative for dizziness, weakness and headaches.   all other systems are negative except as noted in the HPI and PMH.     Physical Exam Updated Vital Signs BP 117/88 (BP Location: Right Arm)   Pulse 71  Temp 98.6 F (37 C) (Oral)   Resp 16   Ht 5\' 6"  (1.676 m)   Wt 83.9 kg (185 lb)   LMP 03/02/2017   SpO2 100%   BMI 29.86 kg/m   Physical Exam  Constitutional: She is oriented to person, place, and time. She appears well-developed and well-nourished. No distress.  HENT:  Head: Normocephalic and atraumatic.  Mouth/Throat: Oropharynx is clear and moist. No oropharyngeal exudate.  Eyes: Conjunctivae and EOM are normal. Pupils are equal, round, and reactive to light.  Neck: Normal range of motion. Neck supple.  No meningismus.  Cardiovascular: Normal rate, regular rhythm, normal heart sounds and intact distal pulses.  No  murmur heard. Pulmonary/Chest: Effort normal and breath sounds normal. No respiratory distress.  Abdominal: Soft. There is no tenderness. There is no rebound and no guarding.  Genitourinary:  Genitourinary Comments: Chaperone present.  Normal external genitalia.  Dark blood in vaginal vault with some clots.  Cervix is closed.  No CMT.  No lateralizing adnexal tenderness  Musculoskeletal: Normal range of motion. She exhibits no edema or tenderness.  Neurological: She is alert and oriented to person, place, and time. No cranial nerve deficit. She exhibits normal muscle tone. Coordination normal.   5/5 strength throughout. CN 2-12 intact.Equal grip strength.   Skin: Skin is warm.  Psychiatric: She has a normal mood and affect. Her behavior is normal.  Nursing note and vitals reviewed.    ED Treatments / Results  Labs (all labs ordered are listed, but only abnormal results are displayed) Labs Reviewed  WET PREP, GENITAL - Abnormal; Notable for the following components:      Result Value   WBC, Wet Prep HPF POC FEW (*)    All other components within normal limits  CBC WITH DIFFERENTIAL/PLATELET - Abnormal; Notable for the following components:   RBC 3.64 (*)    MCV 105.5 (*)    All other components within normal limits  BASIC METABOLIC PANEL - Abnormal; Notable for the following components:   Potassium 3.3 (*)    Glucose, Bld 125 (*)    All other components within normal limits  URINALYSIS, ROUTINE W REFLEX MICROSCOPIC - Abnormal; Notable for the following components:   Hgb urine dipstick LARGE (*)    Protein, ur 30 (*)    Squamous Epithelial / LPF 0-5 (*)    All other components within normal limits  HCG, QUANTITATIVE, PREGNANCY  PREGNANCY, URINE  ABO/RH  GC/CHLAMYDIA PROBE AMP ([place]) NOT AT [facility]    EKG  EKG Interpretation None       Radiology No results found.  Procedures Procedures (including critical care time)  Medications Ordered in ED Medications - No  data to display   Initial Impression / Assessment and Plan / ED Course  I have reviewed the triage vital signs and the nursing notes.  Pertinent labs & imaging results that were available during my care of the patient were reviewed by me and considered in my medical decision making (see chart for details).    Patient with persistent vaginal bleeding since being told she was having a miscarriage last month.  Vitals are stable.  Abdomen is soft without peritoneal signs.  Hemoglobin is 12.2 and stable.  Orthostatics are negative. HCG is negative.  Informed patient that her miscarriage has been completed.  Vitals are stable.  No indication for blood transfusion at this time.  Patient to follow-up with PCP as well as gynecology regarding her vaginal bleeding.  Informed that  her miscarriage has been completed though she may need to have an ultrasound to confirm no retention of products.  However her hCG is negative so this seems less likely. Return precautions discussed.  Final Clinical Impressions(s) / ED Diagnoses   Final diagnoses:  Vaginal bleeding    ED Discharge Orders    None       Syenna Nazir, Jeannett Senior, MD 05/29/17 [phone]

## 2017-05-31 LAB — GC/CHLAMYDIA PROBE AMP (~~LOC~~) NOT AT ARMC
Chlamydia: NEGATIVE
Neisseria Gonorrhea: NEGATIVE

## 2017-06-09 ENCOUNTER — Encounter: Payer: Self-pay | Admitting: *Deleted

## 2017-06-26 ENCOUNTER — Ambulatory Visit: Payer: Medicaid Other | Admitting: Obstetrics & Gynecology

## 2017-06-26 ENCOUNTER — Encounter (INDEPENDENT_AMBULATORY_CARE_PROVIDER_SITE_OTHER): Payer: Self-pay

## 2017-06-26 ENCOUNTER — Encounter: Payer: Self-pay | Admitting: Obstetrics & Gynecology

## 2017-06-26 VITALS — BP 118/92 | HR 80 | Ht 66.0 in | Wt 185.0 lb

## 2017-06-26 DIAGNOSIS — Z3202 Encounter for pregnancy test, result negative: Secondary | ICD-10-CM

## 2017-06-26 DIAGNOSIS — R87613 High grade squamous intraepithelial lesion on cytologic smear of cervix (HGSIL): Secondary | ICD-10-CM

## 2017-06-26 DIAGNOSIS — Z9889 Other specified postprocedural states: Secondary | ICD-10-CM

## 2017-06-26 LAB — POCT URINE PREGNANCY: PREG TEST UR: NEGATIVE

## 2017-06-26 MED ORDER — NYSTATIN-TRIAMCINOLONE 100000-0.1 UNIT/GM-% EX OINT: 1.0000 "application " | TOPICAL_OINTMENT | Freq: Two times a day (BID) | CUTANEOUS | 11 refills | Status: DC

## 2017-06-26 NOTE — Progress Notes (Signed)
Colposcopy Procedure Note:  Colposcopy Procedure Note  Indications: Pap smear 1 months ago showed: high-grade squamous intraepithelial neoplasia  (HGSIL-encompassing moderate and severe dysplasia). The prior pap showed low-grade squamous intraepithelial neoplasia (LGSIL - encompassing HPV,mild dysplasia,CIN I).  Prior cervical/vaginal disease: pt had an endocervical curretage 07/07/2014 which was HSIL but was never followed up. She also [place]  . Prior cervical treatment: LLETZ.  Smoker:  No. New sexual partner:  No.    History of abnormal Pap: yes  Procedure Details  The risks and benefits of the procedure and written informed consent obtained.  Speculum placed in vagina and excellent visualization of cervix achieved, cervix swabbed x 3 with acetic acid solution.  Findings:INadequate colposcopy Cervix: Dense AWE lesion extending up into endocervical canal beyond visualization  ; no biopsies taken. Vaginal inspection: normal without visible lesions. Vulvar colposcopy: vulvar colposcopy not performed.  Specimens: none  Complications: none.  Plan: Pt will need operative management for diagnosis and therapeutic reasons.  She has an inadequate colposcopy with lesion extending up into canal and a positive ECC 3 years ago HSIL without follow up management  Since this is recurrent pt is vaginal hysterectomy as well as a laser conization since she may have completed her child bearing.  She is goin [place]home to discuss with her husband and wil see back next week

## 2017-07-04 ENCOUNTER — Ambulatory Visit: Payer: Medicaid Other | Admitting: Obstetrics & Gynecology

## 2017-07-06 ENCOUNTER — Encounter: Payer: Self-pay | Admitting: Obstetrics & Gynecology

## 2017-07-06 ENCOUNTER — Ambulatory Visit: Payer: Medicaid Other | Admitting: Obstetrics & Gynecology

## 2017-07-06 VITALS — BP 126/74 | HR 90 | Ht 66.0 in | Wt 184.0 lb

## 2017-07-06 DIAGNOSIS — Z9889 Other specified postprocedural states: Secondary | ICD-10-CM | POA: Diagnosis not present

## 2017-07-06 DIAGNOSIS — Z302 Encounter for sterilization: Secondary | ICD-10-CM | POA: Diagnosis not present

## 2017-07-06 DIAGNOSIS — R87613 High grade squamous intraepithelial lesion on cytologic smear of cervix (HGSIL): Secondary | ICD-10-CM

## 2017-07-06 NOTE — Progress Notes (Signed)
Preoperative History and Physical  Lisa Hanna is a 32 y.o. [id number] with No LMP recorded. 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  Pt has completed childbearing She decided for definitive therapy as opposed to laser conization of the cervix  PMH:    Past Medical History:  Diagnosis Date  . Miscarriage   . Panic attack   . Vaginal Pap smear, abnormal     PSH:     Past Surgical History:  Procedure Laterality Date  . HAND SURGERY    . LEEP      POb/GynH:      OB History    Gravida Para Term Preterm AB Living   5 2 2   3 2    SAB TAB Ectopic Multiple Live Births   1              SH:   Social History   Tobacco Use  . Smoking status: Current Some Day Smoker    Packs/day: 0.00    Years: 1.00    Pack years: 0.00    Types: Cigarettes  . Smokeless tobacco: Never Used  Substance Use Topics  . Alcohol use: No    Frequency: Never    Comment: occassional  . Drug use: No    FH:    Family History  Problem Relation Age of Onset  . Asthma Maternal Grandmother   . Hypertension Maternal Grandmother   . Asthma Mother   . Hypertension Mother   . Asthma Brother   . Asthma Daughter      Allergies:  Allergies  Allergen Reactions  . Latex Dermatitis    Medications:       Current Outpatient Medications:  .  nystatin-triamcinolone ointment (MYCOLOG), Apply 1 application topically 2 (two) times daily., Disp: 30 g, Rfl: 11  Review of Systems:   Review of Systems  Constitutional: Negative for fever, chills, weight loss, malaise/fatigue and diaphoresis.  HENT: Negative for hearing loss, ear pain, nosebleeds, congestion, sore throat, neck pain, tinnitus and ear discharge.   Eyes: Negative for blurred vision, double vision, photophobia, pain, discharge and redness.  Respiratory: Negative for cough, hemoptysis, sputum production, shortness of breath, wheezing and  stridor.   Cardiovascular: Negative for chest pain, palpitations, orthopnea, claudication, leg swelling and PND.  Gastrointestinal: Positive for abdominal pain. Negative for heartburn, nausea, vomiting, diarrhea, constipation, blood in stool and melena.  Genitourinary: Negative for dysuria, urgency, frequency, hematuria and flank pain.  Musculoskeletal: Negative for myalgias, back pain, joint pain and falls.  Skin: Negative for itching and rash.  Neurological: Negative for dizziness, tingling, tremors, sensory change, speech change, focal weakness, seizures, loss of consciousness, weakness and headaches.  Endo/Heme/Allergies: Negative for environmental allergies and polydipsia. Does not bruise/bleed easily.  Psychiatric/Behavioral: Negative for depression, suicidal ideas, hallucinations, memory loss and substance abuse. The patient is not nervous/anxious and does not have insomnia.      PHYSICAL EXAM:  Blood pressure 126/74, pulse 90, height 5\' 6"  (1.676 m), weight 184 lb (83.5 kg).    Vitals reviewed. Constitutional: She is oriented to person, place, and time. She appears well-developed and well-nourished.  HENT:  Head: Normocephalic and atraumatic.  Right Ear: External ear normal.  Left Ear: External ear normal.  Nose: Nose normal.  Mouth/Throat: Oropharynx is clear and moist.  Eyes: Conjunctivae and EOM are normal. Pupils are equal, round, and reactive to light. Right eye exhibits no  discharge. Left eye exhibits no discharge. No scleral icterus.  Neck: Normal range of motion. Neck supple. No tracheal deviation present. No thyromegaly present.  Cardiovascular: Normal rate, regular rhythm, normal heart sounds and intact distal pulses.  Exam reveals no gallop and no friction rub.   No murmur heard. Respiratory: Effort normal and breath sounds normal. No respiratory distress. She has no wheezes. She has no rales. She exhibits no tenderness.  GI: Soft. Bowel sounds are normal. She  exhibits no distension and no mass. There is tenderness. There is no rebound and no guarding.  Genitourinary:       Vulva is normal without lesions Vagina is pink moist without discharge Cervix normal in appearance and pap is normal Uterus is normal size, contour, position, consistency, mobility, non-tender Adnexa is negative with normal sized ovaries by sonogram  Musculoskeletal: Normal range of motion. She exhibits no edema and no tenderness.  Neurological: She is alert and oriented to person, place, and time. She has normal reflexes. She displays normal reflexes. No cranial nerve deficit. She exhibits normal muscle tone. Coordination normal.  Skin: Skin is warm and dry. No rash noted. No erythema. No pallor.  Psychiatric: She has a normal mood and affect. Her behavior is normal. Judgment and thought content normal.    Labs: Results for orders placed or performed in visit on 06/26/17 (from the past 336 hour(s))  POCT urine pregnancy   Collection Time: 06/26/17 12:18 PM  Result Value Ref Range   Preg Test, Ur Negative Negative    EKG: Orders placed or performed during the hospital encounter of 08/07/15  . ED EKG  . ED EKG  . EKG 12-Lead  . EKG 12-Lead  . EKG    Imaging Studies: No results found.    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  The patient also understands the alternative treatment options which were discussed in full.  All questions  were answered.  Lazaro Arms 07/06/2017 11:58 AM      Face to face time:  15 minutes  Greater than 50% of the visit time was spent in counseling and coordination of care with the patient.  The summary and outline of the counseling and care coordination is summarized in the note above.   All questions were answered.

## 2017-07-25 NOTE — Patient Instructions (Signed)
Lisa Hanna  07/25/2017     @PREFPERIOPPHARMACY @   Your procedure is scheduled on  08/02/2017   Report to [facility] at  810   A.M.  Call this number if you have problems the morning of surgery:  [phone]   Remember:  Do not eat food or drink liquids after midnight.  Take these medicines the morning of surgery with A SIP OF WATER None   Do not wear jewelry, make-up or nail polish.  Do not wear lotions, powders, or perfumes, or deodorant.  Do not shave 48 hours prior to surgery.  Men may shave face and neck.  Do not bring valuables to the hospital.  [facility] is not responsible for any belongings or valuables.  Contacts, dentures or bridgework may not be worn into surgery.  Leave your suitcase in the car.  After surgery it may be brought to your room.  For patients admitted to the hospital, discharge time will be determined by your treatment team.  Patients discharged the day of surgery will not be allowed to drive home.   Name and phone number of your driver:   family Special instructions:  None  Please read over the following fact sheets that you were given. Anesthesia Post-op Instructions and Care and Recovery After Surgery       Vaginal Hysterectomy A vaginal hysterectomy is a procedure to remove all or part of the uterus through a small incision in the vagina. In this procedure, your health care provider may remove your entire uterus, including the lower end (cervix). You may need a vaginal hysterectomy to treat:  Uterine fibroids.  A condition that causes the lining of the uterus to grow in other areas (endometriosis).  Problems with pelvic support.  Cancer of the cervix, ovaries, uterus, or tissue that lines the uterus (endometrium).  Excessive (dysfunctional) uterine bleeding.  When removing your uterus, your health care provider may also remove the organs that produce eggs (ovaries) and the tubes that carry eggs to your  uterus (fallopian tubes). After a vaginal hysterectomy, you will no longer be able to have a baby. You will also no longer get your menstrual period. Tell a health care provider about:  Any allergies you have.  All medicines you are taking, including vitamins, herbs, eye drops, creams, and over-the-counter medicines.  Any problems you or family members have had with anesthetic medicines.  Any blood disorders you have.  Any surgeries you have had.  Any medical conditions you have.  Whether you are pregnant or may be pregnant. What are the risks? Generally, this is a safe procedure. However, problems may occur, including:  Bleeding.  Infection.  A blood clot that forms in your leg and travels to your lungs (pulmonary embolism).  Damage to surrounding organs.  Pain during sex.  What happens before the procedure?  Ask your health care provider what organs will be removed during surgery.  Ask your health care provider about: ? Changing or stopping your regular medicines. This is especially important if you are taking diabetes medicines or blood thinners. ? Taking medicines such as aspirin and ibuprofen. These medicines can thin your blood. Do not take these medicines before your procedure if your health care provider instructs you not to.  Follow instructions from your health care provider about eating or drinking restrictions.  Do not use any tobacco products, such as cigarettes, chewing tobacco, and e-cigarettes. If you need  help quitting, ask your health care provider.  Plan to have someone take you home after discharge from the hospital. What happens during the procedure?  To reduce your risk of infection: ? Your health care team will wash or sanitize their hands. ? Your skin will be washed with soap.  An IV tube will be inserted into one of your veins.  You may be given antibiotic medicine to help prevent infection.  You will be given one or more of the  following: ? A medicine to help you relax (sedative). ? A medicine to numb the area (local anesthetic). ? A medicine to make you fall asleep (general anesthetic). ? A medicine that is injected into an area of your body to numb everything beyond the injection site (regional anesthetic).  Your surgeon will make an incision in your vagina.  Your surgeon will locate and remove all or part of your uterus.  Your ovaries and fallopian tubes may be removed at the same time.  The incision will be closed with stitches (sutures) that dissolve over time. The procedure may vary among health care providers and hospitals. What happens after the procedure?  Your blood pressure, heart rate, breathing rate, and blood oxygen level will be monitored often until the medicines you were given have worn off.  You will be encouraged to get up and walk around after a few hours to help prevent complications.  You may have IV tubes in place for a few days.  You will be given pain medicine as needed.  Do not drive for 24 hours if you were given a sedative. This information is not intended to replace advice given to you by your health care provider. Make sure you discuss any questions you have with your health care provider. Document Released: 09/07/2015 Document Revised: 10/22/2015 Document Reviewed: 05/31/2015 Elsevier Interactive Patient Education  2018 [employer]  Vaginal Hysterectomy, Care After Refer to this sheet in the next few weeks. These instructions provide you with information about caring for yourself after your procedure. Your health care provider may also give you more specific instructions. Your treatment has been planned according to current medical practices, but problems sometimes occur. Call your health care provider if you have any problems or questions after your procedure. What can I expect after the procedure? After the procedure, it is common to have:  Pain.  Soreness and numbness  in your incision areas.  Vaginal bleeding and discharge.  Constipation.  Temporary problems emptying the bladder.  Feelings of sadness or other emotions.  Follow these instructions at home: Medicines  Take over-the-counter and prescription medicines only as told by your health care provider.  If you were prescribed an antibiotic medicine, take it as told by your health care provider. Do not stop taking the antibiotic even if you start to feel better.  Do not drive or operate heavy machinery while taking prescription pain medicine. Activity  Return to your normal activities as told by your health care provider. Ask your health care provider what activities are safe for you.  Get regular exercise as told by your health care provider. You may be told to take short walks every day and go farther each time.  Do not lift anything that is heavier than 10 lb (4.5 kg). General instructions   Do not put anything in your vagina for 6 weeks after your surgery or as told by your health care provider. This includes tampons and douches.  Do not have sex until your  health care provider says you can.  Do not take baths, swim, or use a hot tub until your health care provider approves.  Drink enough fluid to keep your urine clear or pale yellow.  Do not drive for 24 hours if you were given a sedative.  Keep all follow-up visits as told by your health care provider. This is important. Contact a health care provider if:  Your pain medicine is not helping.  You have a fever.  You have redness, swelling, or pain at your incision site.  You have blood, pus, or a bad-smelling discharge from your vagina.  You continue to have difficulty urinating. Get help right away if:  You have severe abdominal or back pain.  You have heavy bleeding from your vagina.  You have chest pain or shortness of breath. This information is not intended to replace advice given to you by your health care  provider. Make sure you discuss any questions you have with your health care provider. Document Released: 09/07/2015 Document Revised: 10/22/2015 Document Reviewed: 05/31/2015 Elsevier Interactive Patient Education  2018 [employer].  Salpingectomy Salpingectomy, also called tubectomy, is the surgical removal of one of the fallopian tubes. The fallopian tubes are where eggs travel from the ovaries to the uterus. Removing one fallopian tube does not prevent you from becoming pregnant. It also does not cause problems with your menstrual periods. You may need a salpingectomy if you:  Have a fertilized egg that attaches to the fallopian tube (ectopic pregnancy), especially one that causes the tube to burst or tear (rupture).  Have an infected fallopian tube.  Have cancer of the fallopian tube or nearby organs.  Have had an ovary removed due to a cyst or tumor.  Have had your uterus removed.  There are three different methods that can be used for a salpingectomy:  Open. This method involves making one large incision in your abdomen.  Laparoscopic. This method involves using a thin, lighted tube with a tiny camera on the end (laparoscope) to help perform the procedure. The laparoscope will allow your surgeon to make several small incisions in the abdomen instead of a large incision.  Robot-assisted: This method involves using a computer to control surgical instruments that are attached to robotic arms.  Tell a health care provider about:  Any allergies you have.  All medicines you are taking, including vitamins, herbs, eye drops, creams, and over-the-counter medicines.  Any problems you or family members have had with anesthetic medicines.  Any blood disorders you have.  Any surgeries you have had.  Any medical conditions you have.  Whether you are pregnant or may be pregnant. What are the risks? Generally, this is a safe procedure. However, problems may occur,  including:  Infection.  Bleeding.  Allergic reactions to medicines.  Damage to other structures or organs.  Blood clots in the legs or lungs.  What happens before the procedure? Staying hydrated Follow instructions from your health care provider about hydration, which may include:  Up to 2 hours before the procedure - you may continue to drink clear liquids, such as water, clear fruit juice, black coffee, and plain tea.  Eating and drinking restrictions Follow instructions from your health care provider about eating and drinking, which may include:  8 hours before the procedure - stop eating heavy meals or foods such as meat, fried foods, or fatty foods.  6 hours before the procedure - stop eating light meals or foods, such as toast or cereal.  6 hours before the procedure - stop drinking milk or drinks that contain milk.  2 hours before the procedure - stop drinking clear liquids.  Medicines  Ask your health care provider about: ? Changing or stopping your regular medicines. This is especially important if you are taking diabetes medicines or blood thinners. ? Taking medicines such as aspirin and ibuprofen. These medicines can thin your blood. Do not take these medicines before your procedure if your health care provider instructs you not to.  You may be given antibiotic medicine to help prevent infection. General instructions  Do not smoke for at least 2 weeks before your procedure. If you need help quitting, ask your health care provider.  You may have an exam or tests, such as an electrocardiogram (ECG).  You may have a blood or urine sample taken.  Ask your health care provider: ? Whether you should stop removing hair from your surgical area. ? How your surgical site will be marked or identified.  You may be asked to shower with a germ-killing soap.  Plan to have someone take you home from the hospital or clinic.  If you will be going home right after the  procedure, plan to have someone with you for 24 hours. What happens during the procedure?  To reduce your risk of infection: ? Your health care team will wash or sanitize their hands. ? Hair may be removed from the surgical area. ? Your skin will be washed with soap.  An IV tube will be inserted into one of your veins.  You will be given a medicine to make you fall asleep (general anesthetic). You may also be given a medicine to help you relax (sedative).  A thin tube (catheter) may be inserted through your urethra and into your bladder to drain urine during your procedure.  Depending on the type of procedure you are having, one incision or several small incisions will be made in your abdomen.  Your fallopian tube will be cut and removed from where it attaches to your uterus.  Your blood vessels will be clamped and tied to prevent excess bleeding.  The incision(s) in your abdomen will be closed with stitches (sutures), staples, or skin glue.  A bandage (dressing) may be placed over your incision(s). The procedure may vary among health care providers and hospitals. What happens after the procedure?  Your blood pressure, heart rate, breathing rate, and blood oxygen level will be monitored until the medicines you were given have worn off.  You may continue to receive fluids and medicines through an IV tube.  You may continue to have a catheter draining your urine.  You may have to wear compression stockings. These stockings help to prevent blood clots and reduce swelling in your legs.  You will be given pain medicine as needed.  Do not drive for 24 hours if you received a sedative. Summary  Salpingectomy is a surgical procedure to remove one of the fallopian tubes.  The procedure may be done with an open incision, with a laparoscope, or with computer-controlled instruments.  Depending on the type of procedure you are having, one incision or several small incisions will be made  in your abdomen.  Your blood pressure, heart rate, breathing rate, and blood oxygen level will be monitored until the medicines you were given have worn off.  Plan to have someone take you home from the hospital or clinic. This information is not intended to replace advice given to you by your  health care provider. Make sure you discuss any questions you have with your health care provider. Document Released: 10/02/2008 Document Revised: 01/01/2016 Document Reviewed: 11/07/2012 Elsevier Interactive Patient Education  2018 [employer].  General Anesthesia, Adult General anesthesia is the use of medicines to make a person "go to sleep" (be unconscious) for a medical procedure. General anesthesia is often recommended when a procedure:  Is long.  Requires you to be still or in an unusual position.  Is major and can cause you to lose blood.  Is impossible to do without general anesthesia.  The medicines used for general anesthesia are called general anesthetics. In addition to making you sleep, the medicines:  Prevent pain.  Control your blood pressure.  Relax your muscles.  Tell a health care provider about:  Any allergies you have.  All medicines you are taking, including vitamins, herbs, eye drops, creams, and over-the-counter medicines.  Any problems you or family members have had with anesthetic medicines.  Types of anesthetics you have had in the past.  Any bleeding disorders you have.  Any surgeries you have had.  Any medical conditions you have.  Any history of heart or lung conditions, such as heart failure, sleep apnea, or chronic obstructive pulmonary disease (COPD).  Whether you are pregnant or may be pregnant.  Whether you use tobacco, alcohol, marijuana, or street drugs.  Any history of Financial [employer]anner.  Any history of depression or anxiety. What are the risks? Generally, this is a safe procedure. However, problems may occur, including:  Allergic  reaction to anesthetics.  Lung and heart problems.  Inhaling food or liquids from your stomach into your lungs (aspiration).  Injury to nerves.  Waking up during your procedure and being unable to move (rare).  Extreme agitation or a state of mental confusion (delirium) when you wake up from the anesthetic.  Air in the bloodstream, which can lead to stroke.  These problems are more likely to develop if you are having a major surgery or if you have an advanced medical condition. You can prevent some of these complications by answering all of your health care provider's questions thoroughly and by following all pre-procedure instructions. General anesthesia can cause side effects, including:  Nausea or vomiting  A sore throat from the breathing tube.  Feeling cold or shivery.  Feeling tired, washed out, or achy.  Sleepiness or drowsiness.  Confusion or agitation.  What happens before the procedure? Staying hydrated Follow instructions from your health care provider about hydration, which may include:  Up to 2 hours before the procedure - you may continue to drink clear liquids, such as water, clear fruit juice, black coffee, and plain tea.  Eating and drinking restrictions Follow instructions from your health care provider about eating and drinking, which may include:  8 hours before the procedure - stop eating heavy meals or foods such as meat, fried foods, or fatty foods.  6 hours before the procedure - stop eating light meals or foods, such as toast or cereal.  6 hours before the procedure - stop drinking milk or drinks that contain milk.  2 hours before the procedure - stop drinking clear liquids.  Medicines  Ask your health care provider about: ? Changing or stopping your regular medicines. This is especially important if you are taking diabetes medicines or blood thinners. ? Taking medicines such as aspirin and ibuprofen. These medicines can thin your blood. Do  not take these medicines before your procedure if your health care  provider instructs you not to. ? Taking new dietary supplements or medicines. Do not take these during the week before your procedure unless your health care provider approves them.  If you are told to take a medicine or to continue taking a medicine on the day of the procedure, take the medicine with sips of water. General instructions   Ask if you will be going home the same day, the following day, or after a longer hospital stay. ? Plan to have someone take you home. ? Plan to have someone stay with you for the first 24 hours after you leave the hospital or clinic.  For 3-6 weeks before the procedure, try not to use any tobacco products, such as cigarettes, chewing tobacco, and e-cigarettes.  You may brush your teeth on the morning of the procedure, but make sure to spit out the toothpaste. What happens during the procedure?  You will be given anesthetics through a mask and through an IV tube in one of your veins.  You may receive medicine to help you relax (sedative).  As soon as you are asleep, a breathing tube may be used to help you breathe.  An anesthesia specialist will stay with you throughout the procedure. He or she will help keep you comfortable and safe by continuing to give you medicines and adjusting the amount of medicine that you get. He or she will also watch your blood pressure, pulse, and oxygen levels to make sure that the anesthetics do not cause any problems.  If a breathing tube was used to help you breathe, it will be removed before you wake up. The procedure may vary among health care providers and hospitals. What happens after the procedure?  You will wake up, often slowly, after the procedure is complete, usually in a recovery area.  Your blood pressure, heart rate, breathing rate, and blood oxygen level will be monitored until the medicines you were given have worn off.  You may be given  medicine to help you calm down if you feel anxious or agitated.  If you will be going home the same day, your health care provider may check to make sure you can stand, drink, and urinate.  Your health care providers will treat your pain and side effects before you go home.  Do not drive for 24 hours if you received a sedative.  You may: ? Feel nauseous and vomit. ? Have a sore throat. ? Have mental slowness. ? Feel cold or shivery. ? Feel sleepy. ? Feel tired. ? Feel sore or achy, even in parts of your body where you did not have surgery. This information is not intended to replace advice given to you by your health care provider. Make sure you discuss any questions you have with your health care provider. Document Released: 08/23/2007 Document Revised: 10/27/2015 Document Reviewed: 04/30/2015 Elsevier Interactive Patient Education  2018 [employer]. General Anesthesia, Adult, Care After These instructions provide you with information about caring for yourself after your procedure. Your health care provider may also give you more specific instructions. Your treatment has been planned according to current medical practices, but problems sometimes occur. Call your health care provider if you have any problems or questions after your procedure. What can I expect after the procedure? After the procedure, it is common to have:  Vomiting.  A sore throat.  Mental slowness.  It is common to feel:  Nauseous.  Cold or shivery.  Sleepy.  Tired.  Sore or achy, even  in parts of your body where you did not have surgery.  Follow these instructions at home: For at least 24 hours after the procedure:  Do not: ? Participate in activities where you could fall or become injured. ? Drive. ? Use heavy machinery. ? Drink alcohol. ? Take sleeping pills or medicines that cause drowsiness. ? Make important decisions or sign legal documents. ? Take care of children on your  own.  Rest. Eating and drinking  If you vomit, drink water, juice, or soup when you can drink without vomiting.  Drink enough fluid to keep your urine clear or pale yellow.  Make sure you have little or no nausea before eating solid foods.  Follow the diet recommended by your health care provider. General instructions  Have a responsible adult stay with you until you are awake and alert.  Return to your normal activities as told by your health care provider. Ask your health care provider what activities are safe for you.  Take over-the-counter and prescription medicines only as told by your health care provider.  If you smoke, do not smoke without supervision.  Keep all follow-up visits as told by your health care provider. This is important. Contact a health care provider if:  You continue to have nausea or vomiting at home, and medicines are not helpful.  You cannot drink fluids or start eating again.  You cannot urinate after 8-12 hours.  You develop a skin rash.  You have fever.  You have increasing redness at the site of your procedure. Get help right away if:  You have difficulty breathing.  You have chest pain.  You have unexpected bleeding.  You feel that you are having a life-threatening or urgent problem. This information is not intended to replace advice given to you by your health care provider. Make sure you discuss any questions you have with your health care provider. Document Released: 08/22/2000 Document Revised: 10/19/2015 Document Reviewed: 04/30/2015 Elsevier Interactive Patient Education  [employer]2018 Elsevier Inc.

## 2017-07-27 ENCOUNTER — Encounter (HOSPITAL_COMMUNITY)
Admission: RE | Admit: 2017-07-27 | Discharge: 2017-07-27 | Disposition: A | Discharge status: Home / Self Care | Payer: Medicaid Other | Source: Ambulatory Visit | Attending: Obstetrics & Gynecology | Admitting: Obstetrics & Gynecology

## 2017-07-27 ENCOUNTER — Other Ambulatory Visit: Payer: Self-pay

## 2017-07-27 ENCOUNTER — Encounter (HOSPITAL_COMMUNITY): Payer: Self-pay | Admitting: *Deleted

## 2017-07-27 DIAGNOSIS — Z01818 Encounter for other preprocedural examination: Secondary | ICD-10-CM | POA: Diagnosis not present

## 2017-07-27 LAB — URINALYSIS, ROUTINE W REFLEX MICROSCOPIC
Bacteria, UA: NONE SEEN
Bilirubin Urine: NEGATIVE
Glucose, UA: NEGATIVE mg/dL
Ketones, ur: NEGATIVE mg/dL
Leukocytes, UA: NEGATIVE
Nitrite: NEGATIVE
PH: 5 (ref 5.0–8.0)
Protein, ur: NEGATIVE mg/dL
SPECIFIC GRAVITY, URINE: 1.018 (ref 1.005–1.030)

## 2017-07-27 LAB — CBC
HEMATOCRIT: 39.1 % (ref 36.0–46.0)
Hemoglobin: 12.1 g/dL (ref 12.0–15.0)
MCH: 33.2 pg (ref 26.0–34.0)
MCHC: 30.9 g/dL (ref 30.0–36.0)
MCV: 107.1 fL — ABNORMAL HIGH (ref 78.0–100.0)
PLATELETS: 373 10*3/uL (ref 150–400)
RBC: 3.65 MIL/uL — AB (ref 3.87–5.11)
RDW: 12.5 % (ref 11.5–15.5)
WBC: 5.4 10*3/uL (ref 4.0–10.5)

## 2017-07-27 LAB — TYPE AND SCREEN
ABO/RH(D): O POS
ANTIBODY SCREEN: NEGATIVE

## 2017-07-27 LAB — COMPREHENSIVE METABOLIC PANEL
ALT: 25 U/L (ref 14–54)
AST: 20 U/L (ref 15–41)
Albumin: 4.1 g/dL (ref 3.5–5.0)
Alkaline Phosphatase: 82 U/L (ref 38–126)
Anion gap: 12 (ref 5–15)
BUN: 11 mg/dL (ref 6–20)
CALCIUM: 8.9 mg/dL (ref 8.9–10.3)
CO2: 22 mmol/L (ref 22–32)
Chloride: 105 mmol/L (ref 101–111)
Creatinine, Ser: 0.74 mg/dL (ref 0.44–1.00)
GFR calc Af Amer: >60 mL/min (ref >60)
GFR calc non Af Amer: >60 mL/min (ref >60)
GLUCOSE: 72 mg/dL (ref 65–99)
POTASSIUM: 3.9 mmol/L (ref 3.5–5.1)
Sodium: 139 mmol/L (ref 135–145)
Total Bilirubin: 0.2 mg/dL — ABNORMAL LOW (ref 0.3–1.2)
Total Protein: 7.2 g/dL (ref 6.5–8.1)

## 2017-07-27 LAB — RAPID HIV SCREEN (HIV 1/2 AB+AG)
HIV 1/2 Antibodies: NONREACTIVE
HIV-1 P24 Antigen - HIV24: NONREACTIVE

## 2017-07-27 LAB — HCG, QUANTITATIVE, PREGNANCY: hCG, Beta Chain, Quant, S: <1 m[IU]/mL (ref <5)

## 2017-08-02 ENCOUNTER — Encounter (HOSPITAL_COMMUNITY): Payer: Self-pay | Admitting: *Deleted

## 2017-08-02 ENCOUNTER — Inpatient Hospital Stay (HOSPITAL_COMMUNITY)
Admission: RE | Admit: 2017-08-02 | Discharge: 2017-08-03 | DRG: 743 | Disposition: A | Discharge status: Home / Self Care | Payer: Medicaid Other | Source: Ambulatory Visit | Attending: Obstetrics & Gynecology | Admitting: Obstetrics & Gynecology

## 2017-08-02 ENCOUNTER — Encounter (HOSPITAL_COMMUNITY)
Admission: RE | Disposition: A | Discharge status: Home / Self Care | Payer: Self-pay | Source: Ambulatory Visit | Attending: Obstetrics & Gynecology

## 2017-08-02 ENCOUNTER — Ambulatory Visit (HOSPITAL_COMMUNITY): Payer: Medicaid Other | Admitting: Anesthesiology

## 2017-08-02 ENCOUNTER — Other Ambulatory Visit: Payer: Self-pay

## 2017-08-02 DIAGNOSIS — R87613 High grade squamous intraepithelial lesion on cytologic smear of cervix (HGSIL): Principal | ICD-10-CM | POA: Diagnosis present

## 2017-08-02 DIAGNOSIS — N800 Endometriosis of uterus: Secondary | ICD-10-CM

## 2017-08-02 DIAGNOSIS — Z91040 Latex allergy status: Secondary | ICD-10-CM | POA: Diagnosis not present

## 2017-08-02 DIAGNOSIS — R87615 Unsatisfactory cytologic smear of cervix: Secondary | ICD-10-CM | POA: Diagnosis present

## 2017-08-02 DIAGNOSIS — F17210 Nicotine dependence, cigarettes, uncomplicated: Secondary | ICD-10-CM | POA: Diagnosis present

## 2017-08-02 DIAGNOSIS — D060 Carcinoma in situ of endocervix: Secondary | ICD-10-CM

## 2017-08-02 DIAGNOSIS — Z90710 Acquired absence of both cervix and uterus: Secondary | ICD-10-CM | POA: Diagnosis present

## 2017-08-02 HISTORY — PX: VAGINAL HYSTERECTOMY: SHX2639

## 2017-08-02 SURGERY — HYSTERECTOMY, VAGINAL
Anesthesia: General

## 2017-08-02 MED ORDER — MIDAZOLAM HCL 5 MG/5ML IJ SOLN
INTRAMUSCULAR | Status: DC | PRN
Start: 2017-08-02 — End: 2017-08-02
  Administered 2017-08-02: 2 mg via INTRAVENOUS

## 2017-08-02 MED ORDER — LACTATED RINGERS IV SOLN
INTRAVENOUS | Status: DC
  Administered 2017-08-02 (×2): via INTRAVENOUS

## 2017-08-02 MED ORDER — SODIUM CHLORIDE 0.9 % IV SOLN
8.0000 mg | Freq: Four times a day (QID) | INTRAVENOUS | Status: DC | PRN
  Filled 2017-08-02: qty 4

## 2017-08-02 MED ORDER — LEVOFLOXACIN IN D5W 750 MG/150ML IV SOLN
750.0000 mg | Freq: Once | INTRAVENOUS | Status: AC
  Administered 2017-08-02: 750 mg via INTRAVENOUS
  Filled 2017-08-02: qty 150

## 2017-08-02 MED ORDER — PROMETHAZINE HCL 25 MG/ML IJ SOLN: 25.0000 mg | Freq: Four times a day (QID) | INTRAMUSCULAR | Status: DC | PRN

## 2017-08-02 MED ORDER — HYDROMORPHONE HCL 1 MG/ML IJ SOLN
1.0000 mg | INTRAMUSCULAR | Status: DC | PRN
  Administered 2017-08-02: 1 mg via INTRAVENOUS
  Administered 2017-08-02 – 2017-08-03 (×2): 2 mg via INTRAVENOUS
  Filled 2017-08-02 (×2): qty 2
  Filled 2017-08-02: qty 1

## 2017-08-02 MED ORDER — HYDROMORPHONE HCL 1 MG/ML IJ SOLN
0.2500 mg | INTRAMUSCULAR | Status: DC | PRN
  Administered 2017-08-02 (×2): 0.5 mg via INTRAVENOUS
  Filled 2017-08-02: qty 0.5

## 2017-08-02 MED ORDER — SIMETHICONE 80 MG PO CHEW: 80.0000 mg | CHEWABLE_TABLET | Freq: Four times a day (QID) | ORAL | Status: DC | PRN

## 2017-08-02 MED ORDER — DEXAMETHASONE SODIUM PHOSPHATE 4 MG/ML IJ SOLN
INTRAMUSCULAR | Status: AC
  Filled 2017-08-02: qty 1

## 2017-08-02 MED ORDER — BISACODYL 10 MG RE SUPP
10.0000 mg | Freq: Every day | RECTAL | Status: DC | PRN
  Filled 2017-08-02: qty 1

## 2017-08-02 MED ORDER — PROPOFOL 10 MG/ML IV BOLUS
INTRAVENOUS | Status: AC
  Filled 2017-08-02: qty 20

## 2017-08-02 MED ORDER — SUCCINYLCHOLINE CHLORIDE 20 MG/ML IJ SOLN
INTRAMUSCULAR | Status: AC
  Filled 2017-08-02: qty 1

## 2017-08-02 MED ORDER — OXYCODONE-ACETAMINOPHEN 7.5-325 MG PO TABS
1.0000 | ORAL_TABLET | ORAL | Status: DC | PRN
  Administered 2017-08-03 (×2): 1 via ORAL
  Filled 2017-08-02: qty 1
  Filled 2017-08-02: qty 2

## 2017-08-02 MED ORDER — ROCURONIUM BROMIDE 50 MG/5ML IV SOLN
INTRAVENOUS | Status: AC
  Filled 2017-08-02: qty 1

## 2017-08-02 MED ORDER — DOCUSATE SODIUM 100 MG PO CAPS
100.0000 mg | ORAL_CAPSULE | Freq: Two times a day (BID) | ORAL | Status: DC
  Administered 2017-08-02 – 2017-08-03 (×3): 100 mg via ORAL
  Filled 2017-08-02 (×3): qty 1

## 2017-08-02 MED ORDER — MIDAZOLAM HCL 2 MG/2ML IJ SOLN
INTRAMUSCULAR | Status: AC
  Filled 2017-08-02: qty 2

## 2017-08-02 MED ORDER — MIDAZOLAM HCL 2 MG/2ML IJ SOLN
1.0000 mg | INTRAMUSCULAR | Status: DC
  Administered 2017-08-02: 2 mg via INTRAVENOUS

## 2017-08-02 MED ORDER — SENNOSIDES-DOCUSATE SODIUM 8.6-50 MG PO TABS
1.0000 | ORAL_TABLET | Freq: Every evening | ORAL | Status: DC | PRN
  Administered 2017-08-03: 1 via ORAL
  Filled 2017-08-02: qty 1

## 2017-08-02 MED ORDER — LIDOCAINE HCL 1 % IJ SOLN
INTRAMUSCULAR | Status: DC | PRN
Start: 2017-08-02 — End: 2017-08-02
  Administered 2017-08-02: 25 mg via INTRADERMAL

## 2017-08-02 MED ORDER — BUPIVACAINE-EPINEPHRINE (PF) 0.5% -1:200000 IJ SOLN
INTRAMUSCULAR | Status: DC | PRN
  Administered 2017-08-02: 19 mL via PERINEURAL

## 2017-08-02 MED ORDER — ONDANSETRON HCL 4 MG PO TABS
8.0000 mg | ORAL_TABLET | Freq: Four times a day (QID) | ORAL | Status: DC | PRN
  Administered 2017-08-02: 8 mg via ORAL
  Filled 2017-08-02: qty 2

## 2017-08-02 MED ORDER — FENTANYL CITRATE (PF) 100 MCG/2ML IJ SOLN
INTRAMUSCULAR | Status: DC | PRN
  Administered 2017-08-02 (×5): 50 ug via INTRAVENOUS
  Administered 2017-08-02: 100 ug via INTRAVENOUS

## 2017-08-02 MED ORDER — CEFAZOLIN SODIUM-DEXTROSE 2-4 GM/100ML-% IV SOLN
2.0000 g | INTRAVENOUS | Status: AC
  Administered 2017-08-02: 2 g via INTRAVENOUS
  Filled 2017-08-02: qty 100

## 2017-08-02 MED ORDER — ZOLPIDEM TARTRATE 5 MG PO TABS: 5.0000 mg | ORAL_TABLET | Freq: Every evening | ORAL | Status: DC | PRN

## 2017-08-02 MED ORDER — ONDANSETRON HCL 4 MG/2ML IJ SOLN
INTRAMUSCULAR | Status: AC
  Filled 2017-08-02: qty 2

## 2017-08-02 MED ORDER — HYDROMORPHONE HCL 1 MG/ML IJ SOLN
INTRAMUSCULAR | Status: AC
  Filled 2017-08-02: qty 0.5

## 2017-08-02 MED ORDER — ONDANSETRON HCL 4 MG/2ML IJ SOLN
4.0000 mg | Freq: Once | INTRAMUSCULAR | Status: AC
  Administered 2017-08-02: 4 mg via INTRAVENOUS

## 2017-08-02 MED ORDER — FENTANYL CITRATE (PF) 250 MCG/5ML IJ SOLN
INTRAMUSCULAR | Status: AC
  Filled 2017-08-02: qty 5

## 2017-08-02 MED ORDER — LIDOCAINE HCL (PF) 1 % IJ SOLN
INTRAMUSCULAR | Status: AC
  Filled 2017-08-02: qty 5

## 2017-08-02 MED ORDER — KCL IN DEXTROSE-NACL 20-5-0.45 MEQ/L-%-% IV SOLN
INTRAVENOUS | Status: DC
  Administered 2017-08-02 – 2017-08-03 (×2): via INTRAVENOUS

## 2017-08-02 MED ORDER — FENTANYL CITRATE (PF) 100 MCG/2ML IJ SOLN
INTRAMUSCULAR | Status: AC
  Filled 2017-08-02: qty 2

## 2017-08-02 MED ORDER — ROCURONIUM BROMIDE 100 MG/10ML IV SOLN
INTRAVENOUS | Status: DC | PRN
  Administered 2017-08-02: 10 mg via INTRAVENOUS
  Administered 2017-08-02: 50 mg via INTRAVENOUS

## 2017-08-02 MED ORDER — KETOROLAC TROMETHAMINE 30 MG/ML IJ SOLN
30.0000 mg | Freq: Once | INTRAMUSCULAR | Status: AC
  Administered 2017-08-02: 30 mg via INTRAVENOUS
  Filled 2017-08-02: qty 1

## 2017-08-02 MED ORDER — PROPOFOL 10 MG/ML IV BOLUS
INTRAVENOUS | Status: DC | PRN
  Administered 2017-08-02: 20 mg via INTRAVENOUS
  Administered 2017-08-02: 160 mg via INTRAVENOUS

## 2017-08-02 MED ORDER — SUGAMMADEX SODIUM 500 MG/5ML IV SOLN
INTRAVENOUS | Status: DC | PRN
  Administered 2017-08-02: 200 mg via INTRAVENOUS

## 2017-08-02 MED ORDER — KETOROLAC TROMETHAMINE 30 MG/ML IJ SOLN
30.0000 mg | Freq: Four times a day (QID) | INTRAMUSCULAR | Status: AC
  Administered 2017-08-02 – 2017-08-03 (×3): 30 mg via INTRAVENOUS
  Filled 2017-08-02 (×3): qty 1

## 2017-08-02 MED ORDER — DEXAMETHASONE SODIUM PHOSPHATE 4 MG/ML IJ SOLN
4.0000 mg | Freq: Once | INTRAMUSCULAR | Status: AC
  Administered 2017-08-02: 4 mg via INTRAVENOUS

## 2017-08-02 SURGICAL SUPPLY — 37 items
APPLIER CLIP 13 LRG OPEN (CLIP) ×3
BAG HAMPER (MISCELLANEOUS) ×3 IMPLANT
BLADE SURG SZ10 CARB STEEL (BLADE) IMPLANT
CLIP APPLIE 13 LRG OPEN (CLIP) ×1 IMPLANT
CLOTH BEACON ORANGE TIMEOUT ST (SAFETY) ×3 IMPLANT
COVER LIGHT HANDLE STERIS (MISCELLANEOUS) ×6 IMPLANT
DECANTER SPIKE VIAL GLASS SM (MISCELLANEOUS) ×3 IMPLANT
DRAPE HALF SHEET 40X57 (DRAPES) ×3 IMPLANT
DRAPE STERI URO 9X17 APER PCH (DRAPES) ×3 IMPLANT
ELECT REM PT RETURN 9FT ADLT (ELECTROSURGICAL) ×3
ELECTRODE REM PT RTRN 9FT ADLT (ELECTROSURGICAL) ×1 IMPLANT
FORMALIN 10 PREFIL 480ML (MISCELLANEOUS) ×3 IMPLANT
GAUZE SPONGE 4X4 12PLY STRL (GAUZE/BANDAGES/DRESSINGS) ×3 IMPLANT
GLOVE BIOGEL PI IND STRL 7.0 (GLOVE) ×1 IMPLANT
GLOVE BIOGEL PI IND STRL 8 (GLOVE) ×1 IMPLANT
GLOVE BIOGEL PI INDICATOR 7.0 (GLOVE) ×2
GLOVE BIOGEL PI INDICATOR 8 (GLOVE) ×2
GLOVE ECLIPSE 8.0 STRL XLNG CF (GLOVE) ×3 IMPLANT
GOWN STRL REUS W/TWL LRG LVL3 (GOWN DISPOSABLE) ×6 IMPLANT
GOWN STRL REUS W/TWL XL LVL3 (GOWN DISPOSABLE) ×3 IMPLANT
HEMOSTAT ARISTA ABSORB 3G PWDR (MISCELLANEOUS) ×3 IMPLANT
IV NS IRRIG 3000ML ARTHROMATIC (IV SOLUTION) ×3 IMPLANT
KIT ROOM TURNOVER AP CYSTO (KITS) ×3 IMPLANT
MANIFOLD NEPTUNE II (INSTRUMENTS) ×3 IMPLANT
NEEDLE HYPO 22GX1.5 SAFETY (NEEDLE) ×3 IMPLANT
NS IRRIG 1000ML POUR BTL (IV SOLUTION) ×3 IMPLANT
PACK PERI GYN (CUSTOM PROCEDURE TRAY) ×3 IMPLANT
PAD ARMBOARD 7.5X6 YLW CONV (MISCELLANEOUS) ×3 IMPLANT
SET BASIN LINEN APH (SET/KITS/TRAYS/PACK) ×3 IMPLANT
SUT MNCRL+ AB 3-0 CT1 36 (SUTURE) ×1 IMPLANT
SUT MON AB 3-0 SH 27 (SUTURE) IMPLANT
SUT MONOCRYL AB 3-0 CT1 36IN (SUTURE) ×2
SUT VIC AB 0 CT1 27 (SUTURE) ×6
SUT VIC AB 0 CT1 27XCR 8 STRN (SUTURE) ×3 IMPLANT
SYR CONTROL 10ML LL (SYRINGE) ×3 IMPLANT
TRAY FOLEY W/METER SILVER 16FR (SET/KITS/TRAYS/PACK) ×3 IMPLANT
VERSALIGHT (MISCELLANEOUS) ×3 IMPLANT

## 2017-08-02 NOTE — Anesthesia Procedure Notes (Signed)
Procedure Name: Intubation Date/Time: 08/02/2017 10:22 AM Performed by: Charmaine Downs, CRNA Pre-anesthesia Checklist: Patient identified, Patient being monitored, Timeout performed, Emergency Drugs available and Suction available Patient Re-evaluated:Patient Re-evaluated prior to induction Oxygen Delivery Method: Circle System Utilized Preoxygenation: Pre-oxygenation with 100% oxygen Induction Type: IV induction Ventilation: Mask ventilation without difficulty Laryngoscope Size: Mac and 4 Grade View: Grade I Tube type: Oral Tube size: 7.0 mm Number of attempts: 1 Airway Equipment and Method: stylet Placement Confirmation: ETT inserted through vocal cords under direct vision,  positive ETCO2 and breath sounds checked- equal and bilateral Secured at: 22 cm Tube secured with: Tape Dental Injury: Teeth and Oropharynx as per pre-operative assessment

## 2017-08-02 NOTE — H&P (Signed)
Preoperative History and Physical  Lisa Hanna is a 32 y.o. [id number] with No LMP recorded. 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  Pt has completed childbearing She decided for definitive therapy as opposed to laser conization of the cervix  PMH:        Past Medical History:  Diagnosis Date  . Miscarriage   . Panic attack   . Vaginal Pap smear, abnormal     PSH:          Past Surgical History:  Procedure Laterality Date  . HAND SURGERY    . LEEP      POb/GynH:              OB History    Gravida Para Term Preterm AB Living   5 2 2   3 2    SAB TAB Ectopic Multiple Live Births   1              SH:   Social History        Tobacco Use  . Smoking status: Current Some Day Smoker    Packs/day: 0.00    Years: 1.00    Pack years: 0.00    Types: Cigarettes  . Smokeless tobacco: Never Used  Substance Use Topics  . Alcohol use: No    Frequency: Never    Comment: occassional  . Drug use: No    FH:         Family History  Problem Relation Age of Onset  . Asthma Maternal Grandmother   . Hypertension Maternal Grandmother   . Asthma Mother   . Hypertension Mother   . Asthma Brother   . Asthma Daughter      Allergies:      Allergies  Allergen Reactions  . Latex Dermatitis    Medications:       Current Outpatient Medications:  .  nystatin-triamcinolone ointment (MYCOLOG), Apply 1 application topically 2 (two) times daily., Disp: 30 g, Rfl: 11  Review of Systems:   Review of Systems  Constitutional: Negative for fever, chills, weight loss, malaise/fatigue and diaphoresis.  HENT: Negative for hearing loss, ear pain, nosebleeds, congestion, sore throat, neck pain, tinnitus and ear discharge.   Eyes: Negative for blurred vision, double vision, photophobia, pain, discharge and redness.  Respiratory:  Negative for cough, hemoptysis, sputum production, shortness of breath, wheezing and stridor.   Cardiovascular: Negative for chest pain, palpitations, orthopnea, claudication, leg swelling and PND.  Gastrointestinal: Positive for abdominal pain. Negative for heartburn, nausea, vomiting, diarrhea, constipation, blood in stool and melena.  Genitourinary: Negative for dysuria, urgency, frequency, hematuria and flank pain.  Musculoskeletal: Negative for myalgias, back pain, joint pain and falls.  Skin: Negative for itching and rash.  Neurological: Negative for dizziness, tingling, tremors, sensory change, speech change, focal weakness, seizures, loss of consciousness, weakness and headaches.  Endo/Heme/Allergies: Negative for environmental allergies and polydipsia. Does not bruise/bleed easily.  Psychiatric/Behavioral: Negative for depression, suicidal ideas, hallucinations, memory loss and substance abuse. The patient is not nervous/anxious and does not have insomnia.      PHYSICAL EXAM:  Blood pressure 126/74, pulse 90, height 5\' 6"  (1.676 m), weight 184 lb (83.5 kg).    Vitals reviewed. Constitutional: She is oriented to person, place, and time. She appears well-developed and well-nourished.  HENT:  Head: Normocephalic and atraumatic.  Right Ear: External ear normal.  Left Ear: External ear  normal.  Nose: Nose normal.  Mouth/Throat: Oropharynx is clear and moist.  Eyes: Conjunctivae and EOM are normal. Pupils are equal, round, and reactive to light. Right eye exhibits no discharge. Left eye exhibits no discharge. No scleral icterus.  Neck: Normal range of motion. Neck supple. No tracheal deviation present. No thyromegaly present.  Cardiovascular: Normal rate, regular rhythm, normal heart sounds and intact distal pulses.  Exam reveals no gallop and no friction rub.   No murmur heard. Respiratory: Effort normal and breath sounds normal. No respiratory distress. She has no wheezes. She  has no rales. She exhibits no tenderness.  GI: Soft. Bowel sounds are normal. She exhibits no distension and no mass. There is tenderness. There is no rebound and no guarding.  Genitourinary:       Vulva is normal without lesions Vagina is pink moist without discharge Cervix normal in appearance and pap is normal Uterus is normal size, contour, position, consistency, mobility, non-tender Adnexa is negative with normal sized ovaries by sonogram  Musculoskeletal: Normal range of motion. She exhibits no edema and no tenderness.  Neurological: She is alert and oriented to person, place, and time. She has normal reflexes. She displays normal reflexes. No cranial nerve deficit. She exhibits normal muscle tone. Coordination normal.  Skin: Skin is warm and dry. No rash noted. No erythema. No pallor.  Psychiatric: She has a normal mood and affect. Her behavior is normal. Judgment and thought content normal.    Labs:      Results for orders placed or performed in visit on 06/26/17 (from the past 336 hour(s))  POCT urine pregnancy   Collection Time: 06/26/17 12:18 PM  Result Value Ref Range   Preg Test, Ur Negative Negative   Results for orders placed or performed during the hospital encounter of 07/27/17 (from the past 168 hour(s))  CBC   Collection Time: 07/27/17  8:50 AM  Result Value Ref Range   WBC 5.4 4.0 - 10.5 K/uL   RBC 3.65 (L) 3.87 - 5.11 MIL/uL   Hemoglobin 12.1 12.0 - 15.0 g/dL   HCT 16.139.1 09.636.0 - 04.546.0 %   MCV 107.1 (H) 78.0 - 100.0 fL   MCH 33.2 26.0 - 34.0 pg   MCHC 30.9 30.0 - 36.0 g/dL   RDW 40.912.5 81.111.5 - 91.415.5 %   Platelets 373 150 - 400 K/uL  Comprehensive metabolic panel   Collection Time: 07/27/17  8:50 AM  Result Value Ref Range   Sodium 139 135 - 145 mmol/L   Potassium 3.9 3.5 - 5.1 mmol/L   Chloride 105 101 - 111 mmol/L   CO2 22 22 - 32 mmol/L   Glucose, Bld 72 65 - 99 mg/dL   BUN 11 6 - 20 mg/dL   Creatinine, Ser 7.820.74 0.44 - 1.00 mg/dL   Calcium 8.9 8.9 -  95.610.3 mg/dL   Total Protein 7.2 6.5 - 8.1 g/dL   Albumin 4.1 3.5 - 5.0 g/dL   AST 20 15 - 41 U/L   ALT 25 14 - 54 U/L   Alkaline Phosphatase 82 38 - 126 U/L   Total Bilirubin 0.2 (L) 0.3 - 1.2 mg/dL   GFR calc non Af Amer >60 >60 mL/min   GFR calc Af Amer >60 >60 mL/min   Anion gap 12 5 - 15  Rapid HIV screen (HIV 1/2 Ab+Ag)   Collection Time: 07/27/17  8:50 AM  Result Value Ref Range   HIV-1 P24 Antigen - HIV24 NON REACTIVE NON REACTIVE  HIV 1/2 Antibodies NON REACTIVE NON REACTIVE   Interpretation (HIV Ag Ab)      A non reactive test result means that HIV 1 or HIV 2 antibodies and HIV 1 p24 antigen were not detected in the specimen.  hCG, quantitative, pregnancy   Collection Time: 07/27/17  8:51 AM  Result Value Ref Range   hCG, Beta Chain, Quant, S <1 <5 mIU/mL  Urinalysis, Routine w reflex microscopic   Collection Time: 07/27/17  8:51 AM  Result Value Ref Range   Color, Urine YELLOW YELLOW   APPearance CLEAR CLEAR   Specific Gravity, Urine 1.018 1.005 - 1.030   pH 5.0 5.0 - 8.0   Glucose, UA NEGATIVE NEGATIVE mg/dL   Hgb urine dipstick MODERATE (A) NEGATIVE   Bilirubin Urine NEGATIVE NEGATIVE   Ketones, ur NEGATIVE NEGATIVE mg/dL   Protein, ur NEGATIVE NEGATIVE mg/dL   Nitrite NEGATIVE NEGATIVE   Leukocytes, UA NEGATIVE NEGATIVE   RBC / HPF 0-5 0 - 5 RBC/hpf   WBC, UA 0-5 0 - 5 WBC/hpf   Bacteria, UA NONE SEEN NONE SEEN   Squamous Epithelial / LPF 0-5 (A) NONE SEEN   Mucus PRESENT   Type and screen   Collection Time: 07/27/17  8:51 AM  Result Value Ref Range   ABO/RH(D) O POS    Antibody Screen NEG    Sample Expiration      08/10/2017 Performed at [facility], [address], [place], [place] [address]      EKG:    Orders placed or performed during the hospital encounter of 08/07/15  . ED EKG  . ED EKG  . EKG 12-Lead  . EKG 12-Lead  . EKG    Imaging Studies: Imaging Results  No results found.      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  The patient also understands the alternative treatment options which were discussed in full.  All questions were answered.  Lazaro Arms 07/06/2017 11:58 AM  Lazaro Arms, MD 08/02/2017 10:09 AM

## 2017-08-02 NOTE — Anesthesia Postprocedure Evaluation (Signed)
Anesthesia Post Note  Patient: Lisa Hanna  Procedure(s) Performed: HYSTERECTOMY VAGINAL (N/A )  Patient location during evaluation: Nursing Unit Anesthesia Type: General Level of consciousness: awake and alert and patient cooperative Pain management: satisfactory to patient Vital Signs Assessment: post-procedure vital signs reviewed and stable Respiratory status: spontaneous breathing Cardiovascular status: stable Postop Assessment: no apparent nausea or vomiting Anesthetic complications: no     Last Vitals:  Vitals:   08/02/17 1613 08/02/17 1713  BP: 109/75 110/77  Pulse: 68 70  Resp: 19 18  Temp: 36.6 C 36.8 C  SpO2: 97% 96%    Last Pain:  Vitals:   08/02/17 1844  TempSrc:   PainSc: 7                  [name]

## 2017-08-02 NOTE — Interval H&P Note (Signed)
History and Physical Interval Note:  08/02/2017 10:09 AM  Lisa Hanna  has presented today for surgery, with the diagnosis of high grade squamous cervical dysplasia  The various methods of treatment have been discussed with the patient and family. After consideration of risks, benefits and other options for treatment, the patient has consented to  Procedure(s): HYSTERECTOMY VAGINALWITH BILATERAL SALPINGECTOMY (N/A) as a surgical intervention .  The patient's history has been reviewed, patient examined, no change in status, stable for surgery.  I have reviewed the patient's chart and labs.  Questions were answered to the patient's satisfaction.     Lazaro ArmsLuther H Eure

## 2017-08-02 NOTE — Anesthesia Preprocedure Evaluation (Signed)
Anesthesia Evaluation  Patient identified by MRN, date of birth, ID band Patient awake    Reviewed: Allergy & Precautions, NPO status , Patient's Chart, lab work & pertinent test results  Airway Mallampati: I  TM Distance: >3 FB Neck ROM: Full    Dental  (+) Teeth Intact   Pulmonary Current Smoker,    breath sounds clear to auscultation       Cardiovascular negative cardio ROS   Rhythm:Regular Rate:Normal     Neuro/Psych PSYCHIATRIC DISORDERS Anxiety negative neurological ROS     GI/Hepatic negative GI ROS, Neg liver ROS,   Endo/Other  negative endocrine ROS  Renal/GU negative Renal ROS     Musculoskeletal   Abdominal   Peds  Hematology negative hematology ROS (+)   Anesthesia Other Findings   Reproductive/Obstetrics                             Anesthesia Physical Anesthesia Plan  ASA: II  Anesthesia Plan: General   Post-op Pain Management:    Induction: Intravenous  PONV Risk Score and Plan:   Airway Management Planned: Oral ETT  Additional Equipment:   Intra-op Plan:   Post-operative Plan: Extubation in OR  Informed Consent: I have reviewed the patients History and Physical, chart, labs and discussed the procedure including the risks, benefits and alternatives for the proposed anesthesia with the patient or authorized representative who has indicated his/her understanding and acceptance.     Plan Discussed with:   Anesthesia Plan Comments:         Anesthesia Quick Evaluation

## 2017-08-02 NOTE — Transfer of Care (Signed)
Immediate Anesthesia Transfer of Care Note  Patient: Lisa Hanna  Procedure(s) Performed: HYSTERECTOMY VAGINAL (N/A )  Patient Location: PACU  Anesthesia Type:General  Level of Consciousness: awake and patient cooperative  Airway & Oxygen Therapy: Patient Spontanous Breathing and Patient connected to face mask oxygen  Post-op Assessment: Report given to RN, Post -op Vital signs reviewed and stable and Patient moving all extremities  Post vital signs: Reviewed and stable  Last Vitals:  Vitals:   08/02/17 0955 08/02/17 1000  BP:  113/78  Resp: 16 18  SpO2: 94% 93%    Last Pain: There were no vitals filed for this visit.    Patients Stated Pain Goal: 10 (08/02/17 [id number])  Complications: No apparent anesthesia complications

## 2017-08-02 NOTE — Op Note (Signed)
Preoperative diagnosis:  1.  High grade dysplasia with lesion extending into                                                                         endocervical canal                                         2.  Recurrent HSIL, S/P LEEP in past which is probably                                                               responsible for her inadequate colposcopy                                         3.  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 Pt has completed childbearing She decided for definitive therapy as opposed to laser conization of the cervix    Intraoperative findings were uterus is soft mushy consistent with adenomyosis, ovaries are normal  Description of operation:  The patient was taken from the preoperative area to the operating room in stable condition. She was placed in the sitting position and underwent a spinal anesthetic. Once an adequate level of anesthesia was attained she was placed in the dorsal lithotomy position. Patient was prepped and draped in the usual sterile fashion and a Foley catheter was placed.  A weighted speculum was placed and the cervix was grasped with thyroid tenaculums both anteriorly and posteriorly.  0.5% Marcaine plain was injected in a circumferential fashion about the cervix and the electrocautery unit was used to incise the vagina and push at all cervix.  The posterior cul-de-sac was then entered sharply without difficulty.  The uterosacral ligaments were clamped cut and inspection suture ligated and held.  The cardinal ligaments were then clamped cut transfixion  suture ligated and cut. The anterior peritoneum was identified the anterior cul-de-sac was entered sharply without difficulty. The anterior and posterior leaves of the broad ligament were plicated and the uterine vessels were clamped cut and suture ligated. Serial pedicles were taken of the fundus with each pedicle being clamped cut and suture ligated. The utero-ovarian ligaments were crossclamped the uterus was removed and both pedicles were transfixion suture ligated. There was good hemostasis of  all the pedicles. The peritoneum was then closed in a pursestring fashion using 3-0 Vicryl. The anterior posterior vagina was closed in interrupted fashion with good resultant hemostasis. Our closure the lower pelvis and vagina were irrigated vigorously.  The sponge needle and instrument counts were correct x 3.  Total blood loss for the procedure was 100 cc.  The patient received 2 g of Ancef and 30 mg of Toradol IV preoperatively prophylactically.  She was taken to the recovery room in good stable condition awake alert doing well.  Lazaro ArmsLuther H Eure 08/02/2017, 12:23 PM

## 2017-08-03 ENCOUNTER — Encounter (HOSPITAL_COMMUNITY): Payer: Self-pay | Admitting: Obstetrics & Gynecology

## 2017-08-03 ENCOUNTER — Other Ambulatory Visit: Payer: Self-pay | Admitting: Obstetrics & Gynecology

## 2017-08-03 LAB — CBC
HCT: 37.5 % (ref 36.0–46.0)
Hemoglobin: 11.7 g/dL — ABNORMAL LOW (ref 12.0–15.0)
MCH: 33.9 pg (ref 26.0–34.0)
MCHC: 31.2 g/dL (ref 30.0–36.0)
MCV: 108.7 fL — ABNORMAL HIGH (ref 78.0–100.0)
PLATELETS: 334 10*3/uL (ref 150–400)
RBC: 3.45 MIL/uL — AB (ref 3.87–5.11)
RDW: 12.2 % (ref 11.5–15.5)
WBC: 9.2 10*3/uL (ref 4.0–10.5)

## 2017-08-03 LAB — BASIC METABOLIC PANEL
Anion gap: 8 (ref 5–15)
CALCIUM: 8.7 mg/dL — AB (ref 8.9–10.3)
CO2: 23 mmol/L (ref 22–32)
CREATININE: 0.58 mg/dL (ref 0.44–1.00)
Chloride: 107 mmol/L (ref 101–111)
GFR calc Af Amer: >60 mL/min (ref >60)
Glucose, Bld: 90 mg/dL (ref 65–99)
POTASSIUM: 3.5 mmol/L (ref 3.5–5.1)
SODIUM: 138 mmol/L (ref 135–145)

## 2017-08-03 MED ORDER — OXYCODONE-ACETAMINOPHEN 7.5-325 MG PO TABS: 1.0000 | ORAL_TABLET | ORAL | 0 refills | Status: DC | PRN

## 2017-08-03 MED ORDER — PROMETHAZINE HCL 25 MG PO TABS: 25.0000 mg | ORAL_TABLET | Freq: Four times a day (QID) | ORAL | 1 refills | Status: DC | PRN

## 2017-08-03 MED ORDER — CIPROFLOXACIN HCL 500 MG PO TABS: 500.0000 mg | ORAL_TABLET | Freq: Two times a day (BID) | ORAL | 0 refills | Status: DC

## 2017-08-03 MED ORDER — BISACODYL 10 MG RE SUPP: 10.0000 mg | Freq: Every day | RECTAL | 0 refills | Status: DC | PRN

## 2017-08-03 MED ORDER — KETOROLAC TROMETHAMINE 10 MG PO TABS: 10.0000 mg | ORAL_TABLET | Freq: Three times a day (TID) | ORAL | 0 refills | Status: DC | PRN

## 2017-08-03 NOTE — Plan of Care (Signed)
Foley Catheter removed and pt is voiding on her own with no pain or complications.

## 2017-08-03 NOTE — Progress Notes (Signed)
Pt could not make it to her pharmacy in time, so re sent scripts to [facility]  Meds ordered this encounter  Medications  . oxyCODONE-acetaminophen (PERCOCET) 7.5-325 MG tablet    Sig: Take 1-2 tablets by mouth every 4 (four) hours as needed for moderate pain.    Dispense:  30 tablet    Refill:  0  . ketorolac (TORADOL) 10 MG tablet    Sig: Take 1 tablet (10 mg total) by mouth every 8 (eight) hours as needed.    Dispense:  15 tablet    Refill:  0  . ciprofloxacin (CIPRO) 500 MG tablet    Sig: Take 1 tablet (500 mg total) by mouth 2 (two) times daily.    Dispense:  14 tablet    Refill:  0  . promethazine (PHENERGAN) 25 MG tablet    Sig: Take 1 tablet (25 mg total) by mouth every 6 (six) hours as needed for nausea.    Dispense:  30 tablet    Refill:  1

## 2017-08-03 NOTE — Progress Notes (Signed)
Patient continues to have intermittent abdominal cramping. She has been ambulating without difficulty. She has voided in the toilet without difficulty. Minimal vaginal bleeding noted this shift. Presently resting in bed with HOB elevated for comfort and significant other at bedside.

## 2017-08-03 NOTE — Discharge Instructions (Signed)
Vaginal Hysterectomy, Care After °Refer to this sheet in the next few weeks. These instructions provide you with information about caring for yourself after your procedure. Your health care provider may also give you more specific instructions. Your treatment has been planned according to current medical practices, but problems sometimes occur. Call your health care provider if you have any problems or questions after your procedure. °What can I expect after the procedure? °After the procedure, it is common to have: °· Pain. °· Soreness and numbness in your incision areas. °· Vaginal bleeding and discharge. °· Constipation. °· Temporary problems emptying the bladder. °· Feelings of sadness or other emotions. ° °Follow these instructions at home: °Medicines °· Take over-the-counter and prescription medicines only as told by your health care provider. °· If you were prescribed an antibiotic medicine, take it as told by your health care provider. Do not stop taking the antibiotic even if you start to feel better. °· Do not drive or operate heavy machinery while taking prescription pain medicine. °Activity °· Return to your normal activities as told by your health care provider. Ask your health care provider what activities are safe for you. °· Get regular exercise as told by your health care provider. You may be told to take short walks every day and go farther each time. °· Do not lift anything that is heavier than 10 lb (4.5 kg). °General instructions ° °· Do not put anything in your vagina for 6 weeks after your surgery or as told by your health care provider. This includes tampons and douches. °· Do not have sex until your health care provider says you can. °· Do not take baths, swim, or use a hot tub until your health care provider approves. °· Drink enough fluid to keep your urine clear or pale yellow. °· Do not drive for 24 hours if you were given a sedative. °· Keep all follow-up visits as told by your health  care provider. This is important. °Contact a health care provider if: °· Your pain medicine is not helping. °· You have a fever. °· You have redness, swelling, or pain at your incision site. °· You have blood, pus, or a bad-smelling discharge from your vagina. °· You continue to have difficulty urinating. °Get help right away if: °· You have severe abdominal or back pain. °· You have heavy bleeding from your vagina. °· You have chest pain or shortness of breath. °This information is not intended to replace advice given to you by your health care provider. Make sure you discuss any questions you have with your health care provider. °Document Released: 09/07/2015 Document Revised: 10/22/2015 Document Reviewed: 05/31/2015 °Elsevier Interactive Patient Education © 2018 [employer] ° °

## 2017-08-03 NOTE — Progress Notes (Signed)
Removed IV-clean, dry, intact. Reviewed d/c paperwork with pt. Reviewed new medication and where to pick up prescriptions. Pt said she could not drive to [place] where the dr had sent the prescriptions so I paged dr and had him send the prescriptions [facility] [place]. Answered all questions. Wheeled stable pt and belongings to main entrance where her husband picked her up.

## 2017-08-03 NOTE — Discharge Summary (Signed)
Physician Discharge Summary  Patient ID: Lisa Hanna MRN: [id number] DOB/AGE: 34/12/1985 34 y.o.  Admit date: 08/02/2017 Discharge date: 08/03/2017  Admission Diagnoses: HSIL with inadequate colposcopy, recurrent Discharge Diagnoses:  Active Problems:   S/P vaginal hysterectomy   Discharged Condition: good  Hospital Course: unremarkable post op course  Consults: None  Significant Diagnostic Studies: labs:   Results for orders placed or performed during the hospital encounter of 08/02/17 (from the past 24 hour(s))  CBC   Collection Time: 08/03/17  4:44 AM  Result Value Ref Range   WBC 9.2 4.0 - 10.5 K/uL   RBC 3.45 (L) 3.87 - 5.11 MIL/uL   Hemoglobin 11.7 (L) 12.0 - 15.0 g/dL   HCT 40.937.5 81.136.0 - 91.446.0 %   MCV 108.7 (H) 78.0 - 100.0 fL   MCH 33.9 26.0 - 34.0 pg   MCHC 31.2 30.0 - 36.0 g/dL   RDW 78.212.2 95.611.5 - 21.315.5 %   Platelets 334 150 - 400 K/uL  Basic metabolic panel   Collection Time: 08/03/17  4:44 AM  Result Value Ref Range   Sodium 138 135 - 145 mmol/L   Potassium 3.5 3.5 - 5.1 mmol/L   Chloride 107 101 - 111 mmol/L   CO2 23 22 - 32 mmol/L   Glucose, Bld 90 65 - 99 mg/dL   BUN <5 (L) 6 - 20 mg/dL   Creatinine, Ser 0.860.58 0.44 - 1.00 mg/dL   Calcium 8.7 (L) 8.9 - 10.3 mg/dL   GFR calc non Af Amer >60 >60 mL/min   GFR calc Af Amer >60 >60 mL/min   Anion gap 8 5 - 15     Treatments: antibiotics: Levaquin and surgery: TVH  Discharge Exam: Blood pressure 101/69, pulse 70, temperature 98.2 F (36.8 C), temperature source Oral, resp. rate 18, height 5\' 6"  (1.676 m), weight 185 lb (83.9 kg), last menstrual period 07/23/2017, SpO2 98 %. General appearance: alert, cooperative and no distress GI: soft, non-tender; bowel sounds normal; no masses,  no organomegaly  Disposition: 01-Home or Self Care  Discharge Instructions    Call MD for:  persistant nausea and vomiting   Complete by:  As directed    Call MD for:  severe uncontrolled pain   Complete by:  As  directed    Call MD for:  temperature >100.4   Complete by:  As directed    Diet - low sodium heart healthy   Complete by:  As directed    Driving Restrictions   Complete by:  As directed    No driving for 1 week   Increase activity slowly   Complete by:  As directed    Lifting restrictions   Complete by:  As directed    Do not lift more than 10 pounds   No wound care   Complete by:  As directed    Sexual Activity Restrictions   Complete by:  As directed    No sex for 8 weeks     Allergies as of 08/03/2017      Reactions   Latex Dermatitis      Medication List    STOP taking these medications   nystatin ointment Commonly known as:  MYCOSTATIN     TAKE these medications   bisacodyl 10 MG suppository Commonly known as:  DULCOLAX Place 1 suppository (10 mg total) rectally daily as needed for moderate constipation.   ciprofloxacin 500 MG tablet Commonly known as:  CIPRO Take 1 tablet (500 mg total) by mouth  2 (two) times daily.   ketorolac 10 MG tablet Commonly known as:  TORADOL Take 1 tablet (10 mg total) by mouth every 8 (eight) hours as needed.   oxyCODONE-acetaminophen 7.5-325 MG tablet Commonly known as:  PERCOCET Take 1-2 tablets by mouth every 4 (four) hours as needed for moderate pain.   promethazine 25 MG tablet Commonly known as:  PHENERGAN Take 1 tablet (25 mg total) by mouth every 6 (six) hours as needed for nausea.   triamcinolone ointment 0.1 % Commonly known as:  KENALOG Apply 1 application topically 2 (two) times daily as needed. For skin irritation.      Follow-up Information    Lazaro Arms, MD Follow up on 08/10/2017.   Specialties:  Obstetrics and Gynecology, Radiology Contact information: [address] [place] [address] [phone]           Signed: Lazaro Arms 08/03/2017, 1:58 PM

## 2017-08-03 NOTE — Addendum Note (Signed)
Addendum  created 08/03/17 0940 by Despina HiddenIdacavage, Anastacia Reinecke J, CRNA   Sign clinical note

## 2017-08-03 NOTE — Anesthesia Postprocedure Evaluation (Signed)
Anesthesia Post Note  Patient: Lisa Hanna  Procedure(s) Performed: HYSTERECTOMY VAGINAL (N/A )  Patient location during evaluation: Nursing Unit Anesthesia Type: General Level of consciousness: awake and alert and patient cooperative Pain management: pain level controlled Vital Signs Assessment: post-procedure vital signs reviewed and stable Respiratory status: spontaneous breathing, nonlabored ventilation and respiratory function stable Cardiovascular status: blood pressure returned to baseline Postop Assessment: no apparent nausea or vomiting and adequate PO intake Anesthetic complications: no     Last Vitals:  Vitals:   08/02/17 2113 08/03/17 0529  BP: 103/80 97/64  Pulse: 74 66  Resp: 20 20  Temp: 37.2 C 36.6 C  SpO2: 99% 100%    Last Pain:  Vitals:   08/03/17 0625  TempSrc:   PainSc: 0-No pain                 [name] J

## 2017-08-10 ENCOUNTER — Ambulatory Visit (INDEPENDENT_AMBULATORY_CARE_PROVIDER_SITE_OTHER): Payer: Medicaid Other | Admitting: Obstetrics & Gynecology

## 2017-08-10 ENCOUNTER — Encounter: Payer: Self-pay | Admitting: Obstetrics & Gynecology

## 2017-08-10 VITALS — BP 100/60 | HR 76 | Ht 66.0 in | Wt 187.0 lb

## 2017-08-10 DIAGNOSIS — Z90710 Acquired absence of both cervix and uterus: Secondary | ICD-10-CM

## 2017-08-10 DIAGNOSIS — Z9889 Other specified postprocedural states: Secondary | ICD-10-CM

## 2017-08-10 NOTE — Progress Notes (Signed)
  HPI: Patient returns for routine postoperative follow-up having undergone vaginal hysterectomy on August 02, 2017.  The patient's immediate postoperative recovery has been unremarkable. Since hospital discharge the patient reports minimal pain no vaginal bleeding normal bowel and bladder function.   Current Outpatient Medications: ciprofloxacin (CIPRO) 500 MG tablet, Take 1 tablet (500 mg total) by mouth 2 (two) times daily., Disp: 14 tablet, Rfl: 0 ketorolac (TORADOL) 10 MG tablet, Take 1 tablet (10 mg total) by mouth every 8 (eight) hours as needed., Disp: 15 tablet, Rfl: 0 oxyCODONE-acetaminophen (PERCOCET) 7.5-325 MG tablet, Take 1-2 tablets by mouth every 4 (four) hours as needed for moderate pain., Disp: 30 tablet, Rfl: 0 promethazine (PHENERGAN) 25 MG tablet, Take 1 tablet (25 mg total) by mouth every 6 (six) hours as needed for nausea., Disp: 30 tablet, Rfl: 1 bisacodyl (DULCOLAX) 10 MG suppository, Place 1 suppository (10 mg total) rectally daily as needed for moderate constipation. (Patient not taking: Reported on 08/10/2017), Disp: 12 suppository, Rfl: 0 triamcinolone ointment (KENALOG) 0.1 %, Apply 1 application topically 2 (two) times daily as needed. For skin irritation., Disp: , Rfl: 11  No current facility-administered medications for this visit.     Blood pressure 100/60, pulse 76, height 5\' 6"  (1.676 m), weight 187 lb (84.8 kg), last menstrual period 07/23/2017.  Physical Exam: Abdominal exam is benign Vagina is pink and moist no discharge and no bleeding The cuff is intact The cuff is nontender to bimanual there are no midline or adnexal masses  Diagnostic Tests:   Pathology: Benign  Impression: Status post vaginal hysterectomy on 08/02/2017 with normal postoperative course  Plan: Continue to manage her activities with no lifting and nothing per vagina  Follow up: 5  weeks  Lazaro ArmsLuther H Eure, MD

## 2017-09-14 ENCOUNTER — Ambulatory Visit (INDEPENDENT_AMBULATORY_CARE_PROVIDER_SITE_OTHER): Payer: Medicaid Other | Admitting: Obstetrics & Gynecology

## 2017-09-14 ENCOUNTER — Encounter: Payer: Self-pay | Admitting: Obstetrics & Gynecology

## 2017-09-14 VITALS — BP 110/88 | HR 88 | Ht 66.0 in | Wt 186.0 lb

## 2017-09-14 DIAGNOSIS — Z90710 Acquired absence of both cervix and uterus: Secondary | ICD-10-CM

## 2017-09-14 DIAGNOSIS — Z9889 Other specified postprocedural states: Secondary | ICD-10-CM

## 2017-09-14 DIAGNOSIS — R87613 High grade squamous intraepithelial lesion on cytologic smear of cervix (HGSIL): Secondary | ICD-10-CM

## 2017-09-14 MED ORDER — TERCONAZOLE 0.4 % VA CREA: 1.0000 | TOPICAL_CREAM | Freq: Every day | VAGINAL | 0 refills | Status: DC

## 2017-09-14 NOTE — Progress Notes (Signed)
  HPI: Patient returns for routine postoperative follow-up having undergone vaginal hysterectomy on August 02, 2017.  The patient's immediate postoperative recovery has been unremarkable. Since hospital discharge the patient reports no complaints normal bowel and bladder function.   Current Outpatient Medications: triamcinolone ointment (KENALOG) 0.1 %, Apply 1 application topically 2 (two) times daily as needed. For skin irritation., Disp: , Rfl: 11  No current facility-administered medications for this visit.     Blood pressure 110/88, pulse 88, height 5\' 6"  (1.676 m), weight 186 lb (84.4 kg), last menstrual period 07/23/2017.  Physical Exam: Vaginal cuff is healing well but not quite ready for 6 There is evidence of yeast  Diagnostic Tests:   Pathology: Recurrent high-grade dysplasia  Impression: Status post vaginal hysterectomy for recurrent high-grade dysplasia  Plan: Meds ordered this encounter  Medications  . terconazole (TERAZOL 7) 0.4 % vaginal cream    Sig: Place 1 applicator vaginally at bedtime.    Dispense:  45 g    Refill:  0      Follow up: 1  years  It is noted due to the patient's history of recurrent high-grade dysplasia she will need Pap smears of her vaginal cuff going forward  Lazaro ArmsLuther H Jeyden Coffelt, MD

## 2017-11-07 ENCOUNTER — Ambulatory Visit: Payer: Medicaid Other | Admitting: Obstetrics & Gynecology

## 2017-11-14 ENCOUNTER — Ambulatory Visit (INDEPENDENT_AMBULATORY_CARE_PROVIDER_SITE_OTHER): Payer: Medicaid Other | Admitting: Obstetrics & Gynecology

## 2017-11-14 ENCOUNTER — Other Ambulatory Visit: Payer: Self-pay

## 2017-11-14 ENCOUNTER — Encounter: Payer: Self-pay | Admitting: Obstetrics & Gynecology

## 2017-11-14 VITALS — BP 114/85 | HR 79 | Ht 66.0 in | Wt 187.0 lb

## 2017-11-14 DIAGNOSIS — M62830 Muscle spasm of back: Secondary | ICD-10-CM | POA: Diagnosis not present

## 2017-11-14 MED ORDER — CYCLOBENZAPRINE HCL 10 MG PO TABS: 10.0000 mg | ORAL_TABLET | Freq: Three times a day (TID) | ORAL | 1 refills | Status: DC | PRN

## 2017-11-14 NOTE — Progress Notes (Signed)
Chief Complaint  Patient presents with  . Back Pain      32 y.o. [id number] Patient's last menstrual period was 07/23/2017. The current method of family planning is status post hysterectomy.  Outpatient Encounter Medications as of 11/14/2017  Medication Sig Note  . cyclobenzaprine (FLEXERIL) 10 MG tablet Take 1 tablet (10 mg total) by mouth every 8 (eight) hours as needed for muscle spasms.   . [DISCONTINUED] terconazole (TERAZOL 7) 0.4 % vaginal cream Place 1 applicator vaginally at bedtime.   . [DISCONTINUED] triamcinolone ointment (KENALOG) 0.1 % Apply 1 application topically 2 (two) times daily as needed. For skin irritation. 07/18/2017: Mix creams together    No facility-administered encounter medications on file as of 11/14/2017.     Subjective Lisa Hanna began to have right lower back pain with radiation down the right buttock and upper leg about 5 weeks or so post op She does not know of any particular event that precipitated it Sometimes it is worse than others but nothing in particular makes it better or worse, it just sort of happens The pain is achy to stabbing, mild to sharp Past Medical History:  Diagnosis Date  . Miscarriage   . Panic attack   . Vaginal Pap smear, abnormal     Past Surgical History:  Procedure Laterality Date  . HAND SURGERY    . LEEP    . VAGINAL HYSTERECTOMY N/A 08/02/2017   Procedure: HYSTERECTOMY VAGINAL;  Surgeon: Lazaro ArmsEure, Luther H, MD;  Location: [facility];  Service: Gynecology;  Laterality: N/A;  . WISDOM TOOTH EXTRACTION Bilateral     OB History    Gravida  5   Para  2   Term  2   Preterm      AB  3   Living  2     SAB  1   TAB      Ectopic      Multiple      Live Births              Allergies  Allergen Reactions  . Latex Dermatitis    Social History   Socioeconomic History  . Marital status: Married    Spouse name: Not on file  . Number of children: Not on file  . Years of education: Not on  file  . Highest education level: Not on file  Occupational History  . Not on file  Social Needs  . Financial resource strain: Not on file  . Food insecurity:    Worry: Not on file    Inability: Not on file  . Transportation needs:    Medical: Not on file    Non-medical: Not on file  Tobacco Use  . Smoking status: Current Some Day Smoker    Packs/day: 0.25    Years: 1.00    Pack years: 0.25    Types: Cigarettes  . Smokeless tobacco: Never Used  Substance and Sexual Activity  . Alcohol use: Not Currently    Frequency: Never    Comment: occassional  . Drug use: No  . Sexual activity: Yes    Birth control/protection: Surgical    Comment: hyst  Lifestyle  . Physical activity:    Days per week: Not on file    Minutes per session: Not on file  . Stress: Not on file  Relationships  . Social connections:    Talks on phone: Not on file    Gets together: Not on file  Attends religious service: Not on file    Active member of club or organization: Not on file    Attends meetings of clubs or organizations: Not on file    Relationship status: Not on file  Other Topics Concern  . Not on file  Social History Narrative  . Not on file    Family History  Problem Relation Age of Onset  . Asthma Maternal Grandmother   . Hypertension Maternal Grandmother   . Asthma Mother   . Hypertension Mother   . Asthma Brother   . Asthma Daughter     Medications:       Current Outpatient Medications:  .  cyclobenzaprine (FLEXERIL) 10 MG tablet, Take 1 tablet (10 mg total) by mouth every 8 (eight) hours as needed for muscle spasms., Disp: 30 tablet, Rfl: 1  Objective Blood pressure 114/85, pulse 79, height 5\' 6"  (1.676 m), weight 187 lb (84.8 kg), last menstrual period 07/23/2017.  +triggers around the right post superior ischial spine, radiating right Left side is completely negative  Pertinent ROS No burning with urination, frequency or urgency No nausea, vomiting or diarrhea Nor  fever chills or other constitutional symptoms   Labs or studies     Impression Diagnoses this Encounter::   ICD-10-CM   1. Back muscle spasm M62.830     Established relevant diagnosis(es):   Plan/Recommendations: Meds ordered this encounter  Medications  . cyclobenzaprine (FLEXERIL) 10 MG tablet    Sig: Take 1 tablet (10 mg total) by mouth every 8 (eight) hours as needed for muscle spasms.    Dispense:  30 tablet    Refill:  1    Labs or Scans Ordered: No orders of the defined types were placed in this encounter.   Management:: Pt is given local care instructions, lacrosse ball work + Engineer, manufacturing  Flexeril as needed  Pt informed to come back to the office if it persists for local injection, she declines that option today  Follow up Return if symptoms worsen or fail to improve.      All questions were answered.

## 2018-01-03 ENCOUNTER — Telehealth: Payer: Self-pay | Admitting: Obstetrics & Gynecology

## 2018-01-03 ENCOUNTER — Telehealth: Payer: Self-pay | Admitting: *Deleted

## 2018-01-03 MED ORDER — METRONIDAZOLE 0.75 % VA GEL: VAGINAL | 0 refills | Status: DC

## 2018-01-03 NOTE — Telephone Encounter (Signed)
done

## 2018-01-03 NOTE — Telephone Encounter (Signed)
Pt called requesting a medication be sent in for BV. She states that she has a fishy vaginal odor. No c/o discharge or itching.Pt states that she is out of town so she cant come in for an appt. Advised that I would send her request to a provider and she could check with her pharmacy in the next 24 hours.

## 2018-01-03 NOTE — Telephone Encounter (Signed)
Meds ordered this encounter  Medications  . metroNIDAZOLE (METROGEL VAGINAL) 0.75 % vaginal gel    Sig: Nightly x 5 nights    Dispense:  70 g    Refill:  0    

## 2018-03-27 ENCOUNTER — Telehealth: Payer: Self-pay | Admitting: Obstetrics & Gynecology

## 2018-03-27 NOTE — Telephone Encounter (Signed)
Patient called, thinks she may have an UTI.  Does she need to schedule an appointment or come to the office to give an urine sample?  [employer]  [phone]

## 2018-03-28 NOTE — Telephone Encounter (Signed)
Patient states she is having a fishy vaginal odor.  Has had BV several times in the past.  Advised to come in for visit to have discharged assessed.  Verbalized understanding and appt made.

## 2018-03-29 ENCOUNTER — Ambulatory Visit: Payer: Self-pay | Admitting: Women's Health

## 2018-04-06 ENCOUNTER — Encounter (INDEPENDENT_AMBULATORY_CARE_PROVIDER_SITE_OTHER): Payer: PRIVATE HEALTH INSURANCE | Admitting: Women's Health

## 2018-04-10 NOTE — Progress Notes (Signed)
This encounter was created in error - please disregard.

## 2018-06-21 ENCOUNTER — Encounter: Payer: Self-pay | Admitting: Adult Health

## 2018-06-21 ENCOUNTER — Ambulatory Visit (INDEPENDENT_AMBULATORY_CARE_PROVIDER_SITE_OTHER): Payer: PRIVATE HEALTH INSURANCE | Admitting: Adult Health

## 2018-06-21 VITALS — BP 116/85 | HR 81 | Ht 66.0 in | Wt 187.0 lb

## 2018-06-21 DIAGNOSIS — N898 Other specified noninflammatory disorders of vagina: Secondary | ICD-10-CM | POA: Diagnosis not present

## 2018-06-21 DIAGNOSIS — B9689 Other specified bacterial agents as the cause of diseases classified elsewhere: Secondary | ICD-10-CM

## 2018-06-21 DIAGNOSIS — N760 Acute vaginitis: Secondary | ICD-10-CM

## 2018-06-21 LAB — POCT WET PREP (WET MOUNT)
CLUE CELLS WET PREP WHIFF POC: POSITIVE
Trichomonas Wet Prep HPF POC: ABSENT

## 2018-06-21 MED ORDER — FLUCONAZOLE 150 MG PO TABS: ORAL_TABLET | ORAL | 1 refills | Status: DC

## 2018-06-21 MED ORDER — TINIDAZOLE 500 MG PO TABS: ORAL_TABLET | ORAL | 1 refills | Status: DC

## 2018-06-21 NOTE — Progress Notes (Signed)
Patient ID: Lisa Hanna, female   DOB: 1986/02/20, 33 y.o.   MRN: [id number] History of Present Illness: Lisa Hanna is a 33 year old black female, married, sp hysterectomy 08/02/17 for High grade dysplasia , in complaining of vaginal itching and odor, started after shaving with soap an new razor. Has history of BV and does not wants flagyl or metrogel.    Current Medications, Allergies, Past Medical History, Past Surgical History, Family History and Social History were reviewed in [employer] record.     Review of Systems: +vaginal itching +vaginal odor Has low stomach pain, has history of BV    Physical Exam:BP 116/85 (BP Location: Left Arm, Patient Position: Sitting, Cuff Size: Normal)   Pulse 81   Ht 5\' 6"  (1.676 m)   Wt 187 lb (84.8 kg)   LMP 07/23/2017   BMI 30.18 kg/m  General:  Well developed, well nourished, no acute distress Skin:  Warm and dry Pelvic:  External genitalia is normal in appearance, no lesions.  The vagina has red side walls and creamy white discharge with fishy odor. Urethra has no lesions or masses. The cervix and uterus are absent.  No adnexal masses or tenderness noted.Bladder is non tender, no masses felt. Wet prep:few WBCs, +clue cells,+whiff  Psych:  No mood changes, alert and cooperative,seems happy Fall risk is low. PHQ 2 score 0. Examination chaperoned by Federico Flake CMA. She requests tindamax. Have razor just for this area.  Impression: 1. BV (bacterial vaginosis)   2. Vaginal itching   3. Vaginal odor       Plan: Meds ordered this encounter  Medications  . tinidazole (TINDAMAX) 500 MG tablet    Sig: Take 4 today and 4 in am    Dispense:  8 tablet    Refill:  1    Order Specific Question:   Supervising Provider    Answer:   EURE, LUTHER H [[id number]]  . fluconazole (DIFLUCAN) 150 MG tablet    Sig: Take 1 now and repeat 1 in 3 days if needed    Dispense:  2 tablet    Refill:  1    Order Specific Question:    Supervising Provider    Answer:   Lazaro Arms [[id number]]  Return in 2 months for pap with Dr Despina Hidden, as he recommends yearly pap

## 2018-07-23 ENCOUNTER — Telehealth: Payer: Self-pay | Admitting: *Deleted

## 2018-07-23 ENCOUNTER — Other Ambulatory Visit: Payer: Self-pay | Admitting: Adult Health

## 2018-07-23 MED ORDER — METRONIDAZOLE 0.75 % VA GEL: VAGINAL | 0 refills | Status: DC

## 2018-07-23 NOTE — Telephone Encounter (Signed)
Pt c/o discharge with fishy odor. She states that she took the medicine for BV that she was prescribed in Jan. She states that it went away but has come back. She requests that an Rx be sent to her pharmacy. Advised that I would send her request to a provider and she could check with her pharmacy later today. Pt verbalized understanding.

## 2018-07-23 NOTE — Telephone Encounter (Signed)
rx metrogel  °

## 2018-08-01 NOTE — Telephone Encounter (Signed)
Note sent to nurse. 

## 2018-08-20 ENCOUNTER — Other Ambulatory Visit (HOSPITAL_COMMUNITY)
Admission: RE | Admit: 2018-08-20 | Discharge: 2018-08-20 | Disposition: A | Discharge status: Home / Self Care | Payer: PRIVATE HEALTH INSURANCE | Source: Ambulatory Visit | Attending: Obstetrics & Gynecology | Admitting: Obstetrics & Gynecology

## 2018-08-20 ENCOUNTER — Ambulatory Visit (INDEPENDENT_AMBULATORY_CARE_PROVIDER_SITE_OTHER): Payer: PRIVATE HEALTH INSURANCE | Admitting: Obstetrics & Gynecology

## 2018-08-20 ENCOUNTER — Other Ambulatory Visit: Payer: Self-pay

## 2018-08-20 ENCOUNTER — Encounter: Payer: Self-pay | Admitting: Obstetrics & Gynecology

## 2018-08-20 VITALS — BP 124/82 | HR 75 | Ht 66.0 in | Wt 183.0 lb

## 2018-08-20 DIAGNOSIS — Z01419 Encounter for gynecological examination (general) (routine) without abnormal findings: Secondary | ICD-10-CM

## 2018-08-20 DIAGNOSIS — Z90710 Acquired absence of both cervix and uterus: Secondary | ICD-10-CM | POA: Diagnosis present

## 2018-08-20 NOTE — Addendum Note (Signed)
Addended by: Tish Frederickson A on: 08/20/2018 09:28 AM   Modules accepted: Orders

## 2018-08-20 NOTE — Progress Notes (Signed)
Subjective:     Lisa Hanna is a 33 y.o. female here for a routine exam.  Patient's last menstrual period was 07/23/2017. [id number] Birth Control Method:  Vag hyst Menstrual Calendar(currently): amenorrhea  Current complaints: PMDD.   Current acute medical issues:     Recent Gynecologic History Patient's last menstrual period was 07/23/2017. Last Pap: 2019,  normal Last mammogram: ,    Past Medical History:  Diagnosis Date  . Miscarriage   . Panic attack   . Vaginal Pap smear, abnormal     Past Surgical History:  Procedure Laterality Date  . HAND SURGERY    . LEEP    . VAGINAL HYSTERECTOMY N/A 08/02/2017   Procedure: HYSTERECTOMY VAGINAL;  Surgeon: Lazaro Arms, MD;  Location: [facility];  Service: Gynecology;  Laterality: N/A;  . WISDOM TOOTH EXTRACTION Bilateral     OB History    Gravida  5   Para  2   Term  2   Preterm      AB  3   Living  2     SAB  1   TAB      Ectopic      Multiple      Live Births              Social History   Socioeconomic History  . Marital status: Married    Spouse name: Not on file  . Number of children: 2  . Years of education: Not on file  . Highest education level: Not on file  Occupational History  . Not on file  Social Needs  . Financial resource strain: Not on file  . Food insecurity:    Worry: Not on file    Inability: Not on file  . Transportation needs:    Medical: Not on file    Non-medical: Not on file  Tobacco Use  . Smoking status: Current Some Day Smoker    Packs/day: 0.25    Years: 1.00    Pack years: 0.25    Types: Cigarettes  . Smokeless tobacco: Never Used  Substance and Sexual Activity  . Alcohol use: Not Currently    Frequency: Never    Comment: occassional  . Drug use: No  . Sexual activity: Yes    Birth control/protection: Surgical    Comment: hyst  Lifestyle  . Physical activity:    Days per week: Not on file    Minutes per session: Not on file  . Stress: Not on file   Relationships  . Social connections:    Talks on phone: Not on file    Gets together: Not on file    Attends religious service: Not on file    Active member of club or organization: Not on file    Attends meetings of clubs or organizations: Not on file    Relationship status: Not on file  Other Topics Concern  . Not on file  Social History Narrative  . Not on file    Family History  Problem Relation Age of Onset  . Asthma Maternal Grandmother   . Hypertension Maternal Grandmother   . Asthma Mother   . Hypertension Mother   . Asthma Brother   . Asthma Daughter     No current outpatient medications on file.  Review of Systems  Review of Systems  Constitutional: Negative for fever, chills, weight loss, malaise/fatigue and diaphoresis.  HENT: Negative for hearing loss, ear pain, nosebleeds, congestion, sore throat, neck pain, tinnitus  and ear discharge.   Eyes: Negative for blurred vision, double vision, photophobia, pain, discharge and redness.  Respiratory: Negative for cough, hemoptysis, sputum production, shortness of breath, wheezing and stridor.   Cardiovascular: Negative for chest pain, palpitations, orthopnea, claudication, leg swelling and PND.  Gastrointestinal: negative for abdominal pain. Negative for heartburn, nausea, vomiting, diarrhea, constipation, blood in stool and melena.  Genitourinary: Negative for dysuria, urgency, frequency, hematuria and flank pain.  Musculoskeletal: Negative for myalgias, back pain, joint pain and falls.  Skin: Negative for itching and rash.  Neurological: Negative for dizziness, tingling, tremors, sensory change, speech change, focal weakness, seizures, loss of consciousness, weakness and headaches.  Endo/Heme/Allergies: Negative for environmental allergies and polydipsia. Does not bruise/bleed easily.  Psychiatric/Behavioral: Negative for depression, suicidal ideas, hallucinations, memory loss and substance abuse. The patient is not  nervous/anxious and does not have insomnia.        Objective:  Blood pressure 124/82, pulse 75, height 5\' 6"  (1.676 m), weight 183 lb (83 kg), last menstrual period 07/23/2017.   Physical Exam  Vitals reviewed. Constitutional: She is oriented to person, place, and time. She appears well-developed and well-nourished.  HENT:  Head: Normocephalic and atraumatic.        Right Ear: External ear normal.  Left Ear: External ear normal.  Nose: Nose normal.  Mouth/Throat: Oropharynx is clear and moist.  Eyes: Conjunctivae and EOM are normal. Pupils are equal, round, and reactive to light. Right eye exhibits no discharge. Left eye exhibits no discharge. No scleral icterus.  Neck: Normal range of motion. Neck supple. No tracheal deviation present. No thyromegaly present.  Cardiovascular: Normal rate, regular rhythm, normal heart sounds and intact distal pulses.  Exam reveals no gallop and no friction rub.   No murmur heard. Respiratory: Effort normal and breath sounds normal. No respiratory distress. She has no wheezes. She has no rales. She exhibits no tenderness.  GI: Soft. Bowel sounds are normal. She exhibits no distension and no mass. There is no tenderness. There is no rebound and no guarding.  Genitourinary:  Breasts no masses skin changes or nipple changes bilaterally      Vulva is normal without lesions Vagina is pink moist without discharge Cervix absent Uterus is absent Adnexa is negative with normal sized ovaries   Musculoskeletal: Normal range of motion. She exhibits no edema and no tenderness.  Neurological: She is alert and oriented to person, place, and time. She has normal reflexes. She displays normal reflexes. No cranial nerve deficit. She exhibits normal muscle tone. Coordination normal.  Skin: Skin is warm and dry. No rash noted. No erythema. No pallor.  Psychiatric: She has a normal mood and affect. Her behavior is normal. Judgment and thought content normal.        Medications Ordered at today's visit: No orders of the defined types were placed in this encounter.   Other orders placed at today's visit: No orders of the defined types were placed in this encounter.     Assessment:    Healthy female exam.    Plan:    yearly Pap test for 3 years then decide about 3 year interval testing with history of recurrent HSIL s/p vag hyst     Return in about 1 year (around 08/20/2019) for yearly, with Dr Despina Hidden.

## 2018-08-22 LAB — CYTOLOGY - PAP
Diagnosis: NEGATIVE
HPV: NOT DETECTED

## 2018-08-31 ENCOUNTER — Other Ambulatory Visit: Payer: Self-pay | Admitting: Adult Health

## 2018-10-10 ENCOUNTER — Telehealth: Payer: Self-pay | Admitting: Obstetrics & Gynecology

## 2018-10-10 NOTE — Telephone Encounter (Signed)
Pt having vaginal itching and clumpy discharge. Advised it sounds like a yeast infection. Advised to try OTC Monistat and call back if that don't help. Pt voiced understanding. JSY

## 2018-10-10 NOTE — Telephone Encounter (Signed)
Patient called, stated that she is having some itching that started last week, no odor and slight discharge.  [employer]  [phone]

## 2018-10-23 ENCOUNTER — Telehealth: Payer: Self-pay | Admitting: Adult Health

## 2018-10-23 MED ORDER — TINIDAZOLE 500 MG PO TABS: ORAL_TABLET | ORAL | 1 refills | Status: DC

## 2018-10-23 MED ORDER — FLUCONAZOLE 150 MG PO TABS: ORAL_TABLET | ORAL | 1 refills | Status: DC

## 2018-10-23 NOTE — Telephone Encounter (Signed)
Refilled tindamax and rx diflucan

## 2018-10-23 NOTE — Addendum Note (Signed)
Addended by: Cyril Mourning A on: 10/23/2018 12:28 PM   Modules accepted: Orders

## 2018-10-23 NOTE — Telephone Encounter (Addendum)
Spoke with pt. Pt has a fishy odor with no discharge. Pt has frequent BV infections. Pt requesting med for BV. Also, she wants Diflucan. Please advise. Thanks!! JSY

## 2018-10-23 NOTE — Telephone Encounter (Signed)
Patient called stating that she would like to speak with Victorino Dike, pt states that she was given a medication by Victorino Dike to help her with a yeast infection but it didn't work. Pt states that " she smells like a whole fish" Please contact pt

## 2018-10-29 NOTE — Telephone Encounter (Signed)
Pt states the medication is not helping. Requesting to be seen.

## 2018-10-29 NOTE — Telephone Encounter (Signed)
Attempted to call patient back to get her an appointment with Victorino Dike. No answer and no option for VM.

## 2018-10-30 ENCOUNTER — Encounter: Payer: Self-pay | Admitting: Adult Health

## 2018-10-30 ENCOUNTER — Ambulatory Visit (INDEPENDENT_AMBULATORY_CARE_PROVIDER_SITE_OTHER): Payer: PRIVATE HEALTH INSURANCE | Admitting: Adult Health

## 2018-10-30 ENCOUNTER — Other Ambulatory Visit: Payer: Self-pay

## 2018-10-30 VITALS — BP 122/87 | HR 90 | Temp 98.2°F | Ht 66.0 in | Wt 181.4 lb

## 2018-10-30 DIAGNOSIS — N898 Other specified noninflammatory disorders of vagina: Secondary | ICD-10-CM | POA: Insufficient documentation

## 2018-10-30 DIAGNOSIS — N760 Acute vaginitis: Secondary | ICD-10-CM

## 2018-10-30 DIAGNOSIS — B9689 Other specified bacterial agents as the cause of diseases classified elsewhere: Secondary | ICD-10-CM

## 2018-10-30 LAB — POCT WET PREP (WET MOUNT)
Clue Cells Wet Prep Whiff POC: POSITIVE
WBC, Wet Prep HPF POC: POSITIVE

## 2018-10-30 MED ORDER — TINIDAZOLE 500 MG PO TABS: ORAL_TABLET | ORAL | 1 refills | Status: DC

## 2018-10-30 MED ORDER — FLUCONAZOLE 150 MG PO TABS: ORAL_TABLET | ORAL | 1 refills | Status: DC

## 2018-10-30 NOTE — Progress Notes (Signed)
Patient ID: Lisa Hanna, female   DOB: [birth year], 33 y.o.   MRN: [id number] History of Present Illness: Lisa Hanna is a 33 year old black female, separated, sp hysterectomy in complaining of vaginal itching and burning with fishy odor.  PCP is [facility].    Current Medications, Allergies, Past Medical History, Past Surgical History, Family History and Social History were reviewed in [employer] record.     Review of Systems: +vaginal itching and burning +vaginal odor, fishy No new sex partners     Physical Exam:BP 122/87 (BP Location: Right Arm, Patient Position: Sitting, Cuff Size: Normal)   Pulse 90   Temp 98.2 F (36.8 C)   Ht 5\' 6"  (1.676 m)   Wt 181 lb 6.4 oz (82.3 kg)   LMP 07/23/2017   BMI 29.28 kg/m  General:  Well developed, well nourished, no acute distress Skin:  Warm and dry Pelvic:  External genitalia is normal in appearance, no lesions.  The vagina is normal in appearance, with creamy discharge with odor. Urethra has no lesions or masses. The cervix and uterus are absent.  No adnexal masses or tenderness noted.Bladder is non tender, no masses felt. Nuswab obtained. Wet prep: +WBCs, and clue cells  Psych:  No mood changes, alert and cooperative,seems happy Examination chaperoned by Malachy Mood LPN.   Impression and Plan: 1. BV (bacterial vaginosis) -will rx tindamax, and diflucan as she gets yeast sometimes after taking tindamax. Meds ordered this encounter  Medications  . fluconazole (DIFLUCAN) 150 MG tablet    Sig: Take 1 now and repeat 1 in 3 days if needed    Dispense:  2 tablet    Refill:  1    Order Specific Question:   Supervising Provider    Answer:   Despina Hidden, LUTHER H [[id number]]  . tinidazole (TINDAMAX) 500 MG tablet    Sig: TAKE 4 TABLETS BY MOUTH TODAY AND 4 TABLETS TOMORROW MORNING    Dispense:  8 tablet    Refill:  1    Order Specific Question:   Supervising Provider    Answer:   Despina Hidden, LUTHER H [[id number]]   - NuSwab Vaginitis  Plus (VG+) - POCT Wet Prep ([place]et [place]) -no alcohols or sex while taking meds Review handout on BV  2. Vaginal itching - NuSwab Vaginitis Plus (VG+) - POCT Wet Prep ([place]et [place])  3. Vaginal odor - NuSwab Vaginitis Plus (VG+) - POCT Wet Prep [facility]) Follow up prn

## 2018-10-30 NOTE — Patient Instructions (Signed)
Bacterial Vaginosis    Bacterial vaginosis is a vaginal infection that occurs when the normal balance of bacteria in the vagina is disrupted. It results from an overgrowth of certain bacteria. This is the most common vaginal infection among women ages 15-44.  Because bacterial vaginosis increases your risk for STIs (sexually transmitted infections), getting treated can help reduce your risk for chlamydia, gonorrhea, herpes, and HIV (human immunodeficiency virus). Treatment is also important for preventing complications in pregnant women, because this condition can cause an early (premature) delivery.  What are the causes?  This condition is caused by an increase in harmful bacteria that are normally present in small amounts in the vagina. However, the reason that the condition develops is not fully understood.  What increases the risk?  The following factors may make you more likely to develop this condition:  · Having a new sexual partner or multiple sexual partners.  · Having unprotected sex.  · Douching.  · Having an intrauterine device (IUD).  · Smoking.  · Drug and alcohol abuse.  · Taking certain antibiotic medicines.  · Being pregnant.  You cannot get bacterial vaginosis from toilet seats, bedding, swimming pools, or contact with objects around you.  What are the signs or symptoms?  Symptoms of this condition include:  · Grey or white vaginal discharge. The discharge can also be watery or foamy.  · A fish-like odor with discharge, especially after sexual intercourse or during menstruation.  · Itching in and around the vagina.  · Burning or pain with urination.  Some women with bacterial vaginosis have no signs or symptoms.  How is this diagnosed?  This condition is diagnosed based on:  · Your medical history.  · A physical exam of the vagina.  · Testing a sample of vaginal fluid under a microscope to look for a large amount of bad bacteria or abnormal cells. Your health care provider may use a cotton swab or  a small wooden spatula to collect the sample.  How is this treated?  This condition is treated with antibiotics. These may be given as a pill, a vaginal cream, or a medicine that is put into the vagina (suppository). If the condition comes back after treatment, a second round of antibiotics may be needed.  Follow these instructions at home:  Medicines  · Take over-the-counter and prescription medicines only as told by your health care provider.  · Take or use your antibiotic as told by your health care provider. Do not stop taking or using the antibiotic even if you start to feel better.  General instructions  · If you have a female sexual partner, tell her that you have a vaginal infection. She should see her health care provider and be treated if she has symptoms. If you have a female sexual partner, he does not need treatment.  · During treatment:  ? Avoid sexual activity until you finish treatment.  ? Do not douche.  ? Avoid alcohol as directed by your health care provider.  ? Avoid breastfeeding as directed by your health care provider.  · Drink enough water and fluids to keep your urine clear or pale yellow.  · Keep the area around your vagina and rectum clean.  ? Wash the area daily with warm water.  ? Wipe yourself from front to back after using the toilet.  · Keep all follow-up visits as told by your health care provider. This is important.  How is this prevented?  · Do not   douche.  · Wash the outside of your vagina with warm water only.  · Use protection when having sex. This includes latex condoms and dental dams.  · Limit how many sexual partners you have. To help prevent bacterial vaginosis, it is best to have sex with just one partner (monogamous).  · Make sure you and your sexual partner are tested for STIs.  · Wear cotton or cotton-lined underwear.  · Avoid wearing tight pants and pantyhose, especially during summer.  · Limit the amount of alcohol that you drink.  · Do not use any products that contain  nicotine or tobacco, such as cigarettes and e-cigarettes. If you need help quitting, ask your health care provider.  · Do not use illegal drugs.  Where to find more information  · Centers for Disease Control and Prevention: www.cdc.gov/std  · American Sexual Health Association (ASHA): www.ashastd.org  · U.S. Department of Health and Human Services, Office on Women's Health: www.womenshealth.gov/ or https://www.womenshealth.gov/a-z-topics/bacterial-vaginosis  Contact a health care provider if:  · Your symptoms do not improve, even after treatment.  · You have more discharge or pain when urinating.  · You have a fever.  · You have pain in your abdomen.  · You have pain during sex.  · You have vaginal bleeding between periods.  Summary  · Bacterial vaginosis is a vaginal infection that occurs when the normal balance of bacteria in the vagina is disrupted.  · Because bacterial vaginosis increases your risk for STIs (sexually transmitted infections), getting treated can help reduce your risk for chlamydia, gonorrhea, herpes, and HIV (human immunodeficiency virus). Treatment is also important for preventing complications in pregnant women, because the condition can cause an early (premature) delivery.  · This condition is treated with antibiotic medicines. These may be given as a pill, a vaginal cream, or a medicine that is put into the vagina (suppository).  This information is not intended to replace advice given to you by your health care provider. Make sure you discuss any questions you have with your health care provider.  Document Released: 05/16/2005 Document Revised: 09/19/2016 Document Reviewed: 01/30/2016  Elsevier Interactive Patient Education © 2019 [employer]

## 2018-11-07 LAB — NUSWAB VAGINITIS PLUS (VG+)
Atopobium vaginae: HIGH Score — AB
Candida albicans, NAA: POSITIVE — AB
Candida glabrata, NAA: NEGATIVE
Chlamydia trachomatis, NAA: NEGATIVE
Megasphaera 1: HIGH Score — AB
Neisseria gonorrhoeae, NAA: NEGATIVE
Trich vag by NAA: NEGATIVE

## 2019-01-30 ENCOUNTER — Other Ambulatory Visit: Payer: Self-pay | Admitting: Adult Health

## 2019-02-14 ENCOUNTER — Other Ambulatory Visit: Payer: Self-pay

## 2019-02-14 ENCOUNTER — Other Ambulatory Visit: Payer: PRIVATE HEALTH INSURANCE | Admitting: *Deleted

## 2019-02-14 DIAGNOSIS — N898 Other specified noninflammatory disorders of vagina: Secondary | ICD-10-CM

## 2019-02-14 NOTE — Progress Notes (Signed)
Pt in to do self swab. Reports vaginal odor that smells fishy.

## 2019-02-19 ENCOUNTER — Other Ambulatory Visit: Payer: Self-pay | Admitting: Women's Health

## 2019-02-19 LAB — NUSWAB VAGINITIS PLUS (VG+)
Atopobium vaginae: HIGH Score — AB
BVAB 2: HIGH Score — AB
Candida albicans, NAA: NEGATIVE
Candida glabrata, NAA: NEGATIVE
Chlamydia trachomatis, NAA: NEGATIVE
Megasphaera 1: HIGH Score — AB
Neisseria gonorrhoeae, NAA: NEGATIVE
Trich vag by NAA: NEGATIVE

## 2019-02-19 MED ORDER — METRONIDAZOLE 500 MG PO TABS: 500.0000 mg | ORAL_TABLET | Freq: Two times a day (BID) | ORAL | 0 refills | Status: DC

## 2019-04-25 ENCOUNTER — Other Ambulatory Visit: Payer: Self-pay

## 2019-04-25 ENCOUNTER — Encounter (HOSPITAL_COMMUNITY): Payer: Self-pay | Admitting: *Deleted

## 2019-04-25 ENCOUNTER — Emergency Department (HOSPITAL_COMMUNITY)
Admission: EM | Admit: 2019-04-25 | Discharge: 2019-04-25 | Disposition: A | Discharge status: Home / Self Care | Payer: PRIVATE HEALTH INSURANCE | Attending: Emergency Medicine | Admitting: Emergency Medicine

## 2019-04-25 DIAGNOSIS — F17210 Nicotine dependence, cigarettes, uncomplicated: Secondary | ICD-10-CM | POA: Insufficient documentation

## 2019-04-25 DIAGNOSIS — R519 Headache, unspecified: Secondary | ICD-10-CM | POA: Insufficient documentation

## 2019-04-25 MED ORDER — SODIUM CHLORIDE 0.9 % IV BOLUS
1000.0000 mL | Freq: Once | INTRAVENOUS | Status: AC
Start: 2019-04-25 — End: 2019-04-25
  Administered 2019-04-25: 1000 mL via INTRAVENOUS

## 2019-04-25 MED ORDER — DIPHENHYDRAMINE HCL 50 MG/ML IJ SOLN
25.0000 mg | Freq: Once | INTRAMUSCULAR | Status: AC
  Administered 2019-04-25: 18:00:00 25 mg via INTRAVENOUS
  Filled 2019-04-25: qty 1

## 2019-04-25 MED ORDER — DEXAMETHASONE SODIUM PHOSPHATE 10 MG/ML IJ SOLN
10.0000 mg | Freq: Once | INTRAMUSCULAR | Status: AC
  Administered 2019-04-25: 10 mg via INTRAVENOUS
  Filled 2019-04-25: qty 1

## 2019-04-25 MED ORDER — METOCLOPRAMIDE HCL 5 MG/ML IJ SOLN
10.0000 mg | Freq: Once | INTRAMUSCULAR | Status: AC
  Administered 2019-04-25: 10 mg via INTRAVENOUS
  Filled 2019-04-25: qty 2

## 2019-04-25 MED ORDER — BUTALBITAL-APAP-CAFFEINE 50-325-40 MG PO TABS: 1.0000 | ORAL_TABLET | Freq: Four times a day (QID) | ORAL | 0 refills | Status: AC | PRN

## 2019-04-25 MED ORDER — KETOROLAC TROMETHAMINE 30 MG/ML IJ SOLN
15.0000 mg | Freq: Once | INTRAMUSCULAR | Status: AC
  Administered 2019-04-25: 15 mg via INTRAVENOUS
  Filled 2019-04-25: qty 1

## 2019-04-25 MED ORDER — FLUTICASONE PROPIONATE 50 MCG/ACT NA SUSP: 2.0000 | Freq: Every day | NASAL | 2 refills | Status: DC

## 2019-04-25 NOTE — ED Triage Notes (Signed)
C/o headache onset yesterday

## 2019-04-27 ENCOUNTER — Emergency Department (HOSPITAL_COMMUNITY): Payer: Self-pay

## 2019-04-27 ENCOUNTER — Other Ambulatory Visit: Payer: Self-pay

## 2019-04-27 ENCOUNTER — Encounter (HOSPITAL_COMMUNITY): Payer: Self-pay | Admitting: Emergency Medicine

## 2019-04-27 ENCOUNTER — Emergency Department (HOSPITAL_COMMUNITY)
Admission: EM | Admit: 2019-04-27 | Discharge: 2019-04-27 | Disposition: A | Discharge status: Home / Self Care | Payer: Self-pay | Attending: Emergency Medicine | Admitting: Emergency Medicine

## 2019-04-27 DIAGNOSIS — Z91040 Latex allergy status: Secondary | ICD-10-CM | POA: Insufficient documentation

## 2019-04-27 DIAGNOSIS — F419 Anxiety disorder, unspecified: Secondary | ICD-10-CM | POA: Insufficient documentation

## 2019-04-27 DIAGNOSIS — F17210 Nicotine dependence, cigarettes, uncomplicated: Secondary | ICD-10-CM | POA: Insufficient documentation

## 2019-04-27 DIAGNOSIS — R42 Dizziness and giddiness: Secondary | ICD-10-CM | POA: Insufficient documentation

## 2019-04-27 DIAGNOSIS — Z79899 Other long term (current) drug therapy: Secondary | ICD-10-CM | POA: Insufficient documentation

## 2019-04-27 DIAGNOSIS — E876 Hypokalemia: Secondary | ICD-10-CM | POA: Insufficient documentation

## 2019-04-27 LAB — BASIC METABOLIC PANEL
Anion gap: 8 (ref 5–15)
BUN: 9 mg/dL (ref 6–20)
CO2: 26 mmol/L (ref 22–32)
Calcium: 8.1 mg/dL — ABNORMAL LOW (ref 8.9–10.3)
Chloride: 104 mmol/L (ref 98–111)
Creatinine, Ser: 0.8 mg/dL (ref 0.44–1.00)
GFR calc Af Amer: >60 mL/min (ref >60)
GFR calc non Af Amer: >60 mL/min (ref >60)
Glucose, Bld: 80 mg/dL (ref 70–99)
Potassium: 3.2 mmol/L — ABNORMAL LOW (ref 3.5–5.1)
Sodium: 138 mmol/L (ref 135–145)

## 2019-04-27 LAB — CBC WITH DIFFERENTIAL/PLATELET
Abs Immature Granulocytes: 0.02 10*3/uL (ref 0.00–0.07)
Basophils Absolute: 0 10*3/uL (ref 0.0–0.1)
Basophils Relative: 0 %
Eosinophils Absolute: 0 10*3/uL (ref 0.0–0.5)
Eosinophils Relative: 0 %
HCT: 38.7 % (ref 36.0–46.0)
Hemoglobin: 12.6 g/dL (ref 12.0–15.0)
Immature Granulocytes: 0 %
Lymphocytes Relative: 23 %
Lymphs Abs: 1.1 10*3/uL (ref 0.7–4.0)
MCH: 35.3 pg — ABNORMAL HIGH (ref 26.0–34.0)
MCHC: 32.6 g/dL (ref 30.0–36.0)
MCV: 108.4 fL — ABNORMAL HIGH (ref 80.0–100.0)
Monocytes Absolute: 0.3 10*3/uL (ref 0.1–1.0)
Monocytes Relative: 5 %
Neutro Abs: 3.6 10*3/uL (ref 1.7–7.7)
Neutrophils Relative %: 72 %
Platelets: 257 10*3/uL (ref 150–400)
RBC: 3.57 MIL/uL — ABNORMAL LOW (ref 3.87–5.11)
RDW: 11.7 % (ref 11.5–15.5)
WBC: 5 10*3/uL (ref 4.0–10.5)
nRBC: 0 % (ref 0.0–0.2)

## 2019-04-27 LAB — TROPONIN I (HIGH SENSITIVITY): Troponin I (High Sensitivity): <2 ng/L (ref <18)

## 2019-04-27 MED ORDER — POTASSIUM CHLORIDE CRYS ER 20 MEQ PO TBCR: 20.0000 meq | EXTENDED_RELEASE_TABLET | Freq: Two times a day (BID) | ORAL | 0 refills | Status: DC

## 2019-04-27 MED ORDER — ALPRAZOLAM 0.25 MG PO TABS: 0.2500 mg | ORAL_TABLET | Freq: Two times a day (BID) | ORAL | 0 refills | Status: DC | PRN

## 2019-04-27 MED ORDER — POTASSIUM CHLORIDE CRYS ER 20 MEQ PO TBCR
20.0000 meq | EXTENDED_RELEASE_TABLET | Freq: Once | ORAL | Status: AC
  Administered 2019-04-27: 19:00:00 20 meq via ORAL
  Filled 2019-04-27: qty 1

## 2019-04-27 NOTE — ED Notes (Signed)
ED Provider at bedside. 

## 2019-04-27 NOTE — Discharge Instructions (Addendum)
Your lab tests, chest xray and ekg are normal today except that your potassium is a little low so you are being supplemented for the next week to improve this. This finding is not the source of your symptoms however - common symptoms of a low potassium are fatigue and muscle cramps. I suspect your symptoms are from stress/anxiety. I have prescribed a small quantity of an anxiety medicine that you can take if needed.  Plan to see your MD for a recheck next week if your symptoms persist, or return after the prescription is gone.

## 2019-04-27 NOTE — ED Triage Notes (Signed)
Pt states that she is feeling dizziness and nervous with some  Chest tighness. This started last night

## 2019-04-27 NOTE — ED Provider Notes (Signed)
[facility] EMERGENCY DEPARTMENT Provider Note   CSN: [id number] Arrival date & time: 04/27/19  1537     History   Chief Complaint Chief Complaint  Patient presents with  . Dizziness  . Anxiety    HPI Lisa Hanna is a 33 y.o. female with no significant past medical history presenting with suspected panic and anxiety. She was at home last night when she started to think about the possibility of getting Covid 19 (denies sx or exposure) when she became very nervous, felt slight lightheaded with some chest tightness.  Her symptoms improved but then came back again today.  She denies sob, palpitations, wheezing, chest pain, also denies headache, n/v, abdominal pain, diaphoresis.  She reports history of panic attacks and these symptoms are similar.  She was on Paxil in the past for this condition. She denies SI/HI.  Her symptoms are currently gone.     The history is provided by the patient.    Past Medical History:  Diagnosis Date  . Miscarriage   . Panic attack   . Vaginal Pap smear, abnormal     Patient Active Problem List   Diagnosis Date Noted  . BV (bacterial vaginosis) 10/30/2018  . Vaginal itching 10/30/2018  . Vaginal odor 10/30/2018  . S/P vaginal hysterectomy 08/02/2017  . 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 LSIL (low grade squamous intraepithelial lesion) on Pap smear 11/27/2010  . Status post LEEP (loop electrosurgical excision procedure) of cervix 11/27/2010    Past Surgical History:  Procedure Laterality Date  . HAND SURGERY    . LEEP    . VAGINAL HYSTERECTOMY N/A 08/02/2017   Procedure: HYSTERECTOMY VAGINAL;  Surgeon: Lazaro ArmsEure, Luther H, MD;  Location: [facility];  Service: Gynecology;  Laterality: N/A;  . WISDOM TOOTH EXTRACTION Bilateral      OB History    Gravida  5   Para  2   Term  2   Preterm      AB  3   Living  2     SAB  1   TAB      Ectopic      Multiple      Live Births               Home  Medications    Prior to Admission medications   Medication Sig Start Date End Date Taking? Authorizing Provider  butalbital-acetaminophen-caffeine (FIORICET) 50-325-40 MG tablet Take 1 tablet by mouth every 6 (six) hours as needed for headache. 04/25/19 04/24/20 Yes Raeford RazorKohut, Stephen, MD  fluticasone [facility](FLONASE) 50 MCG/ACT nasal spray Place 2 sprays into both nostrils daily. 04/25/19  Yes Raeford RazorKohut, Stephen, MD  tinidazole (TINDAMAX) 500 MG tablet Take 2,000 mg by mouth 2 (two) times daily. 04/26/19  Yes [provider]  ALPRAZolam (XANAX) 0.25 MG tablet Take 1 tablet (0.25 mg total) by mouth 2 (two) times daily as needed for anxiety. 04/27/19   Burgess AmorIdol, Markel Kurtenbach, PA-C  potassium chloride SA (KLOR-CON) 20 MEQ tablet Take 1 tablet (20 mEq total) by mouth 2 (two) times daily. 04/27/19   Burgess AmorIdol, Deondrick Searls, PA-C    Family History Family History  Problem Relation Age of Onset  . Asthma Maternal Grandmother   . Hypertension Maternal Grandmother   . Asthma Mother   . Hypertension Mother   . Asthma Brother   . Asthma Daughter     Social History Social History   Tobacco Use  . Smoking status: Current Some Day Smoker  Packs/day: 0.25    Years: 1.00    Pack years: 0.25    Types: Cigarettes  . Smokeless tobacco: Never Used  Substance Use Topics  . Alcohol use: Not Currently    Frequency: Never    Comment: occassional  . Drug use: No     Allergies   Latex   Review of Systems Review of Systems  Respiratory: Positive for chest tightness.   Cardiovascular: Negative.   Gastrointestinal: Negative.   Neurological: Positive for light-headedness.  Psychiatric/Behavioral: The patient is nervous/anxious.   All other systems reviewed and are negative.    Physical Exam Updated Vital Signs BP 110/81   Pulse 87   Temp 98.7 F (37.1 C) (Oral)   Resp 17   Ht 5\' 6"  (1.676 m)   Wt 78.5 kg   LMP 07/23/2017   SpO2 97%   BMI 27.92 kg/m   Physical Exam Vitals signs and nursing note reviewed.   Constitutional:      Appearance: She is well-developed.  HENT:     Head: Normocephalic and atraumatic.  Eyes:     Conjunctiva/sclera: Conjunctivae normal.  Neck:     Musculoskeletal: Normal range of motion.  Cardiovascular:     Rate and Rhythm: Normal rate and regular rhythm.     Heart sounds: Normal heart sounds.  Pulmonary:     Effort: Pulmonary effort is normal.     Breath sounds: Normal breath sounds. No wheezing.  Abdominal:     General: Bowel sounds are normal.     Palpations: Abdomen is soft.     Tenderness: There is no abdominal tenderness.  Musculoskeletal: Normal range of motion.     Right lower leg: No edema.     Left lower leg: No edema.  Skin:    General: Skin is warm and dry.  Neurological:     General: No focal deficit present.     Mental Status: She is alert and oriented to person, place, and time.  Psychiatric:        Mood and Affect: Mood normal.      ED Treatments / Results  Labs (all labs ordered are listed, but only abnormal results are displayed) Labs Reviewed  BASIC METABOLIC PANEL - Abnormal; Notable for the following components:      Result Value   Potassium 3.2 (*)    Calcium 8.1 (*)    All other components within normal limits  CBC WITH DIFFERENTIAL/PLATELET - Abnormal; Notable for the following components:   RBC 3.57 (*)    MCV 108.4 (*)    MCH 35.3 (*)    All other components within normal limits  TROPONIN I (HIGH SENSITIVITY)    EKG EKG Interpretation  Date/Time:  Saturday April 27 2019 16:48:02 EST Ventricular Rate:  82 PR Interval:    QRS Duration: 76 QT Interval:  356 QTC Calculation: 416 R Axis:   35 Text Interpretation: Sinus rhythm Baseline wander in lead(s) I III aVL Confirmed by 02-04-1971 [id number]) on 04/27/2019 6:33:37 PM   Radiology Dg Chest Portable 1 View  Result Date: 04/27/2019 CLINICAL DATA:  33 year old female with history of shortness of breath and pleuritic chest pain. EXAM: PORTABLE CHEST 1 VIEW  COMPARISON:  Chest x-ray 08/07/2015. FINDINGS: Lung volumes are normal. No consolidative airspace disease. No pleural effusions. No pneumothorax. No pulmonary nodule or mass noted. Pulmonary vasculature and the cardiomediastinal silhouette are within normal limits. IMPRESSION: No radiographic evidence of acute cardiopulmonary disease. Electronically Signed   By: 10/07/2015  M.D.   On: 04/27/2019 17:36    Procedures Procedures (including critical care time)  Medications Ordered in ED Medications  potassium chloride SA (KLOR-CON) CR tablet 20 mEq (20 mEq Oral Given 04/27/19 1910)     Initial Impression / Assessment and Plan / ED Course  I have reviewed the triage vital signs and the nursing notes.  Pertinent labs & imaging results that were available during my care of the patient were reviewed by me and considered in my medical decision making (see chart for details).        Pt with h/o anxiety/panicwith recent escalation in sx, currently asymptomatic.  Labs, cxr, ekg unremarkable except for hypokalemia.  Discussed findings with pt.  Suspect sx are from anxiety, medically cleared with no evidence of cardiac or respiratory involvement.  She was placed on potassium, also prescribed small quantity of xanax for short term prn use - advised she needs to f/u with her pcp if sx persist.  Pt understands and agrees with plan.   Final Clinical Impressions(s) / ED Diagnoses   Final diagnoses:  Anxiety  Hypokalemia    ED Discharge Orders         Ordered    potassium chloride SA (KLOR-CON) 20 MEQ tablet  2 times daily     04/27/19 1834    ALPRAZolam (XANAX) 0.25 MG tablet  2 times daily PRN     04/27/19 1836           Evalee Jefferson, PA-C 04/29/19 0006    Virgel Manifold, MD 04/30/19 1055

## 2019-04-30 NOTE — ED Provider Notes (Signed)
[facility] EMERGENCY DEPARTMENT Provider Note   CSN: [id number] Arrival date & time: 04/25/19  1600     History   Chief Complaint Chief Complaint  Patient presents with  . Headache    HPI Lisa Hanna is a 33 y.o. female.     HPI  33 year old female with headache. Onset yesterday.  Persistent since then.  Pain is diffuse but worse in the frontal region.  Does not lateralize.  Denies any trauma or strain.  No fevers.  No visual changes.  No neck pain or neck stiffness.  Denies any significant headache history.  Past Medical History:  Diagnosis Date  . Miscarriage   . Panic attack   . Vaginal Pap smear, abnormal     Patient Active Problem List   Diagnosis Date Noted  . BV (bacterial vaginosis) 10/30/2018  . Vaginal itching 10/30/2018  . Vaginal odor 10/30/2018  . S/P vaginal hysterectomy 08/02/2017  . 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 LSIL (low grade squamous intraepithelial lesion) on Pap smear 11/27/2010  . Status post LEEP (loop electrosurgical excision procedure) of cervix 11/27/2010    Past Surgical History:  Procedure Laterality Date  . HAND SURGERY    . LEEP    . VAGINAL HYSTERECTOMY N/A 08/02/2017   Procedure: HYSTERECTOMY VAGINAL;  Surgeon: Lazaro Arms, MD;  Location: [facility];  Service: Gynecology;  Laterality: N/A;  . WISDOM TOOTH EXTRACTION Bilateral      OB History    Gravida  5   Para  2   Term  2   Preterm      AB  3   Living  2     SAB  1   TAB      Ectopic      Multiple      Live Births               Home Medications    Prior to Admission medications   Medication Sig Start Date End Date Taking? Authorizing Provider  ALPRAZolam (XANAX) 0.25 MG tablet Take 1 tablet (0.25 mg total) by mouth 2 (two) times daily as needed for anxiety. 04/27/19   Burgess Amor, PA-C  butalbital-acetaminophen-caffeine (FIORICET) 50-325-40 MG tablet Take 1 tablet by mouth every 6 (six) hours as needed for  headache. 04/25/19 04/24/20  Raeford Razor, MD  fluticasone (FLONASE) 50 MCG/ACT nasal spray Place 2 sprays into both nostrils daily. 04/25/19   Raeford Razor, MD  potassium chloride SA (KLOR-CON) 20 MEQ tablet Take 1 tablet (20 mEq total) by mouth 2 (two) times daily. 04/27/19   Burgess Amor, PA-C  tinidazole (TINDAMAX) 500 MG tablet Take 2,000 mg by mouth 2 (two) times daily. 04/26/19   [provider]    Family History Family History  Problem Relation Age of Onset  . Asthma Maternal Grandmother   . Hypertension Maternal Grandmother   . Asthma Mother   . Hypertension Mother   . Asthma Brother   . Asthma Daughter     Social History Social History   Tobacco Use  . Smoking status: Current Some Day Smoker    Packs/day: 0.25    Years: 1.00    Pack years: 0.25    Types: Cigarettes  . Smokeless tobacco: Never Used  Substance Use Topics  . Alcohol use: Not Currently    Frequency: Never    Comment: occassional  . Drug use: No     Allergies   Latex   Review of Systems Review  of Systems  All systems reviewed and negative, other than as noted in HPI.  Physical Exam Updated Vital Signs BP 110/83   Pulse 83   Temp 99.8 F (37.7 C) (Oral)   Resp 16   Ht 5\' 6"  (1.676 m)   Wt 78.5 kg   LMP 07/23/2017   SpO2 96%   BMI 27.92 kg/m   Physical Exam Vitals signs and nursing note reviewed.  Constitutional:      General: She is not in acute distress.    Appearance: She is well-developed.  HENT:     Head: Normocephalic and atraumatic.  Eyes:     General:        Right eye: No discharge.        Left eye: No discharge.     Conjunctiva/sclera: Conjunctivae normal.  Neck:     Musculoskeletal: Neck supple. No neck rigidity.  Cardiovascular:     Rate and Rhythm: Normal rate and regular rhythm.     Heart sounds: Normal heart sounds. No murmur. No friction rub. No gallop.   Pulmonary:     Effort: Pulmonary effort is normal. No respiratory distress.     Breath  sounds: Normal breath sounds.  Abdominal:     General: There is no distension.     Palpations: Abdomen is soft.     Tenderness: There is no abdominal tenderness.  Musculoskeletal:        General: No tenderness.  Skin:    General: Skin is warm and dry.  Neurological:     General: No focal deficit present.     Mental Status: She is alert and oriented to person, place, and time.     Cranial Nerves: No cranial nerve deficit.     Sensory: No sensory deficit.     Motor: No weakness.     Coordination: Coordination normal.  Psychiatric:        Behavior: Behavior normal.        Thought Content: Thought content normal.      ED Treatments / Results  Labs (all labs ordered are listed, but only abnormal results are displayed) Labs Reviewed - No data to display  EKG None  Radiology No results found.  Procedures Procedures (including critical care time)  Medications Ordered in ED Medications  ketorolac (TORADOL) 30 MG/ML injection 15 mg (15 mg Intravenous Given 04/25/19 1756)  metoCLOPramide (REGLAN) injection 10 mg (10 mg Intravenous Given 04/25/19 1754)  diphenhydrAMINE (BENADRYL) injection 25 mg (25 mg Intravenous Given 04/25/19 1758)  dexamethasone (DECADRON) injection 10 mg (10 mg Intravenous Given 04/25/19 1757)  sodium chloride 0.9 % bolus 1,000 mL (0 mLs Intravenous Stopped 04/25/19 1928)     Initial Impression / Assessment and Plan / ED Course  I have reviewed the triage vital signs and the nursing notes.  Pertinent labs & imaging results that were available during my care of the patient were reviewed by me and considered in my medical decision making (see chart for details).       33 year old female with headache.  I suspect this may be actually be sinus.  Neuro exam nonfocal.  Doubt serious etiology such as meningitis, bleed, mass, etc.  Feeling better after symptomatic treatment.  Plan continue symptomatic treatment.  Return precautions discussed.  Outpatient  follow-up otherwise. Final Clinical Impressions(s) / ED Diagnoses   Final diagnoses:  Bad headache    ED Discharge Orders         Ordered    fluticasone (FLONASE) 50 MCG/ACT nasal  spray  Daily     04/25/19 1900    butalbital-acetaminophen-caffeine (FIORICET) 50-325-40 MG tablet  Every 6 hours PRN     04/25/19 1900           Raeford RazorKohut, Brynnley Dayrit, MD 04/30/19 1112

## 2019-05-05 ENCOUNTER — Other Ambulatory Visit: Payer: Self-pay

## 2019-05-05 ENCOUNTER — Emergency Department (HOSPITAL_COMMUNITY)
Admission: EM | Admit: 2019-05-05 | Discharge: 2019-05-05 | Disposition: A | Discharge status: Home / Self Care | Payer: Self-pay | Attending: Emergency Medicine | Admitting: Emergency Medicine

## 2019-05-05 ENCOUNTER — Encounter (HOSPITAL_COMMUNITY): Payer: Self-pay | Admitting: *Deleted

## 2019-05-05 ENCOUNTER — Emergency Department (HOSPITAL_COMMUNITY): Payer: Self-pay

## 2019-05-05 DIAGNOSIS — F17210 Nicotine dependence, cigarettes, uncomplicated: Secondary | ICD-10-CM | POA: Insufficient documentation

## 2019-05-05 DIAGNOSIS — J189 Pneumonia, unspecified organism: Secondary | ICD-10-CM | POA: Insufficient documentation

## 2019-05-05 DIAGNOSIS — R091 Pleurisy: Secondary | ICD-10-CM | POA: Insufficient documentation

## 2019-05-05 DIAGNOSIS — Z79899 Other long term (current) drug therapy: Secondary | ICD-10-CM | POA: Insufficient documentation

## 2019-05-05 MED ORDER — AZITHROMYCIN 250 MG PO TABS: 250.0000 mg | ORAL_TABLET | Freq: Every day | ORAL | 0 refills | Status: DC

## 2019-05-05 MED ORDER — IBUPROFEN 800 MG PO TABS
800.0000 mg | ORAL_TABLET | Freq: Once | ORAL | Status: AC
  Administered 2019-05-05: 800 mg via ORAL
  Filled 2019-05-05: qty 1

## 2019-05-05 MED ORDER — MELOXICAM 7.5 MG PO TABS: 7.5000 mg | ORAL_TABLET | Freq: Two times a day (BID) | ORAL | 0 refills | Status: AC | PRN

## 2019-05-05 NOTE — ED Triage Notes (Signed)
Pt c/o chest pain beginning morning of 12/5 becoming worse during the evening, pain 9/10 when she breaths in, no pain with arm movement.

## 2019-05-05 NOTE — Discharge Instructions (Signed)
Your EKG was normal Your xray shows a small pneumonia There is no signs of heart disease Please take Mobic, twice a day as needed for pain Zithromax daily for 5 days as prescribed to treat the pneumonia This should gradually get better over the next week to 10 days Seek a medical exam for increasing pain shortness of breath fever or any other severe or concerning symptoms  You should have a chest x-ray within 2 weeks to make sure that you are x-ray has normalized, therefore tell your doctor about your visit to the emergency department so they can arrange for a repeat x-ray

## 2019-05-05 NOTE — ED Provider Notes (Signed)
[facility] EMERGENCY DEPARTMENT Provider Note   CSN: [id number] Arrival date & time: 05/05/19  0436     History   Chief Complaint Chief Complaint  Patient presents with  . Chest Pain    HPI Lisa Hanna is a 33 y.o. female.     HPI  This patient is a 33 year old female, she has a history of recently being seen in the emergency department for a panic attack however she states that she takes no daily medications, has been in her usual state of health except for some intermittent left-sided chest pain that gets worse with breathing, usually lasts for a very short time however for the last 24 hours she has had a constant left-sided chest pain which feels sharp and stabbing, it is worse with breathing and radiates from the left front of the chest to the left posterior thorax.  She has no abdominal pain, no pain in the shoulders, no shortness of breath or cough, no fevers or chills, no nausea vomiting or diarrhea and no swelling of the legs.  She denies any risk factors for pulmonary embolism including oral contraceptive use, smoking (occasional cigarette), pregnancy (partial hysterectomy), recent travel, trauma, immobilization, surgery.  She has no history of cardiac disease, she denies any exertional symptoms.  She has not been sick with any coughing fevers or coronavirus-like symptoms.  Past Medical History:  Diagnosis Date  . Miscarriage   . Panic attack   . Vaginal Pap smear, abnormal     Patient Active Problem List   Diagnosis Date Noted  . BV (bacterial vaginosis) 10/30/2018  . Vaginal itching 10/30/2018  . Vaginal odor 10/30/2018  . S/P vaginal hysterectomy 08/02/2017  . 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 LSIL (low grade squamous intraepithelial lesion) on Pap smear 11/27/2010  . Status post LEEP (loop electrosurgical excision procedure) of cervix 11/27/2010    Past Surgical History:  Procedure Laterality Date  . HAND SURGERY    .  LEEP    . VAGINAL HYSTERECTOMY N/A 08/02/2017   Procedure: HYSTERECTOMY VAGINAL;  Surgeon: Lazaro Arms, MD;  Location: [facility];  Service: Gynecology;  Laterality: N/A;  . WISDOM TOOTH EXTRACTION Bilateral      OB History    Gravida  5   Para  2   Term  2   Preterm      AB  3   Living  2     SAB  1   TAB      Ectopic      Multiple      Live Births               Home Medications    Prior to Admission medications   Medication Sig Start Date End Date Taking? Authorizing Provider  ALPRAZolam (XANAX) 0.25 MG tablet Take 1 tablet (0.25 mg total) by mouth 2 (two) times daily as needed for anxiety. 04/27/19   Burgess Amor, PA-C  butalbital-acetaminophen-caffeine (FIORICET) 50-325-40 MG tablet Take 1 tablet by mouth every 6 (six) hours as needed for headache. 04/25/19 04/24/20  Raeford Razor, MD  fluticasone (FLONASE) 50 MCG/ACT nasal spray Place 2 sprays into both nostrils daily. 04/25/19   Raeford Razor, MD  meloxicam (MOBIC) 7.5 MG tablet Take 1 tablet (7.5 mg total) by mouth 2 (two) times daily as needed for up to 14 days for pain. 05/05/19 05/19/19  Eber Hong, MD  potassium chloride SA (KLOR-CON) 20 MEQ tablet Take 1 tablet (20 mEq total) by  mouth 2 (two) times daily. 04/27/19   Evalee Jefferson, PA-C  tinidazole (TINDAMAX) 500 MG tablet Take 2,000 mg by mouth 2 (two) times daily. 04/26/19   [provider]    Family History Family History  Problem Relation Age of Onset  . Asthma Maternal Grandmother   . Hypertension Maternal Grandmother   . Asthma Mother   . Hypertension Mother   . Asthma Brother   . Asthma Daughter     Social History Social History   Tobacco Use  . Smoking status: Current Some Day Smoker    Packs/day: 0.25    Years: 1.00    Pack years: 0.25    Types: Cigarettes  . Smokeless tobacco: Never Used  Substance Use Topics  . Alcohol use: Not Currently    Frequency: Never    Comment: occassional  . Drug use: No     Allergies    Latex   Review of Systems Review of Systems  All other systems reviewed and are negative.    Physical Exam Updated Vital Signs BP 106/81   Pulse 78   Temp 98.5 F (36.9 C) (Oral)   Resp 19   Ht 1.676 m (5\' 6" )   Wt 78.5 kg   LMP 07/23/2017   SpO2 95%   BMI 27.92 kg/m   Physical Exam Vitals signs and nursing note reviewed.  Constitutional:      General: She is not in acute distress.    Appearance: She is well-developed.  HENT:     Head: Normocephalic and atraumatic.     Mouth/Throat:     Pharynx: No oropharyngeal exudate.  Eyes:     General: No scleral icterus.       Right eye: No discharge.        Left eye: No discharge.     Conjunctiva/sclera: Conjunctivae normal.     Pupils: Pupils are equal, round, and reactive to light.  Neck:     Musculoskeletal: Normal range of motion and neck supple.     Thyroid: No thyromegaly.     Vascular: No JVD.  Cardiovascular:     Rate and Rhythm: Normal rate and regular rhythm.     Heart sounds: Normal heart sounds. No murmur. No friction rub. No gallop.   Pulmonary:     Effort: Pulmonary effort is normal. No respiratory distress.     Breath sounds: Normal breath sounds. No wheezing or rales.     Comments: Lung sounds are normal, the patient does have some hesitancy to take deep breaths secondary to pain Abdominal:     General: Bowel sounds are normal. There is no distension.     Palpations: Abdomen is soft. There is no mass.     Tenderness: There is no abdominal tenderness.  Musculoskeletal: Normal range of motion.        General: No tenderness.  Lymphadenopathy:     Cervical: No cervical adenopathy.  Skin:    General: Skin is warm and dry.     Findings: No erythema or rash.  Neurological:     Mental Status: She is alert.     Coordination: Coordination normal.  Psychiatric:        Behavior: Behavior normal.      ED Treatments / Results  Labs (all labs ordered are listed, but only abnormal results are displayed)  Labs Reviewed - No data to display  EKG EKG Interpretation  Date/Time:  Sunday May 05 2019 04:58:33 EST Ventricular Rate:  87 PR Interval:  QRS Duration: 81 QT Interval:  348 QTC Calculation: 419 R Axis:   44 Text Interpretation: Sinus rhythm since last tracing no significant change Confirmed by Eber HongMiller, Salli Bodin ([id number]) on 05/05/2019 5:06:52 AM   Radiology Dg Chest 2 View  Result Date: 05/05/2019 CLINICAL DATA:  Left-sided pleuritic chest pain beginning yesterday. EXAM: CHEST - 2 VIEW COMPARISON:  04/27/2019 FINDINGS: The heart size and mediastinal contours are within normal limits. New mild infiltrate is seen in the left lung base. Right lung remains clear. No evidence of pleural effusion or pneumothorax. IMPRESSION: New mild infiltrate in left lung base. Electronically Signed   By: Danae OrleansJohn A Stahl M.D.   On: 05/05/2019 06:10    Procedures Procedures (including critical care time)  Medications Ordered in ED Medications  ibuprofen (ADVIL) tablet 800 mg (has no administration in time range)     Initial Impression / Assessment and Plan / ED Course  I have reviewed the triage vital signs and the nursing notes.  Pertinent labs & imaging results that were available during my care of the patient were reviewed by me and considered in my medical decision making (see chart for details).        The patient is not tachycardic, she has no edema, she has a normal EKG without signs of ischemia right heart strain or arrhythmia.  We will obtain a chest x-ray to make sure there is no signs of pneumothorax or pneumonia, noting that she has no risk for coronary artery disease or pulmonary embolism, however the patient is well-appearing and can be treated with anti-inflammatories for likely pleurisy.  She is agreeable to the plan  The EKG is totally normal without any signs of arrhythmia or ischemia  I have personally looked at the chest x-ray, 2 view PA and lateral, I see no signs of  pneumothorax, , abnormal mediastinum, subdiaphragmatic air or abnormal skin or soft tissue.  There is however a small infiltrate at the left base, I agree with the radiologist. The patient was given an anti-inflammatory, she will be discharged home on Mobic, she was given instructions for return and is agreeable.  Final Clinical Impressions(s) / ED Diagnoses   Final diagnoses:  Pleurisy  Community acquired pneumonia of left lower lobe of lung    ED Discharge Orders         Ordered    meloxicam (MOBIC) 7.5 MG tablet  2 times daily PRN     05/05/19 0608    azithromycin (ZITHROMAX Z-PAK) 250 MG tablet  Daily     05/05/19 0615           Eber HongMiller, Dawnisha Marquina, MD 05/05/19 [phone]

## 2019-05-28 ENCOUNTER — Other Ambulatory Visit: Payer: Self-pay | Admitting: Adult Health

## 2019-05-28 MED ORDER — METRONIDAZOLE 500 MG PO TABS: 500.0000 mg | ORAL_TABLET | Freq: Two times a day (BID) | ORAL | 0 refills | Status: DC

## 2019-05-28 NOTE — Progress Notes (Signed)
Will refill flagyl  

## 2019-06-25 ENCOUNTER — Other Ambulatory Visit: Payer: Self-pay

## 2019-06-25 ENCOUNTER — Ambulatory Visit
Admission: EM | Admit: 2019-06-25 | Discharge: 2019-06-25 | Disposition: A | Discharge status: Home / Self Care | Payer: Medicaid Other | Attending: Emergency Medicine | Admitting: Emergency Medicine

## 2019-06-25 DIAGNOSIS — Z202 Contact with and (suspected) exposure to infections with a predominantly sexual mode of transmission: Secondary | ICD-10-CM

## 2019-06-25 MED ORDER — METRONIDAZOLE 500 MG PO TABS
2000.0000 mg | ORAL_TABLET | Freq: Once | ORAL | Status: AC
  Administered 2019-06-25: 2000 mg via ORAL

## 2019-06-25 NOTE — Discharge Instructions (Addendum)
vaginal self swab/ cervical swab obtained Someone will call with abnormal result Metronidazole 2 g given in office for possible trichomonas (do not take while consuming alcohol) We will follow up with you regarding the results of your test If tests are positive, please abstain from sexual activity for at least 7 days and notify partners Follow up with PCP if symptoms persists Return here or go to ER if you have any new or worsening symptoms

## 2019-06-25 NOTE — ED Provider Notes (Signed)
[facility]    CSN: [id number] Arrival date & time: 06/25/19  1733      History   Chief Complaint Chief Complaint  Patient presents with  . APPT 1800- s74.5    HPI Lisa Hanna is a 34 y.o. female.   Lisa Hanna presents with a complaint of vaginal discharge and odor for the past 2 days.  She denies a precipitating event, recent sexual encounter or recent antibiotic use.  Patient is sexually active with 1 female partner.  Describes discharge as thin white and fishy odor.  Report she has not tried any OTC medication.  Report worsening symptoms with urination.  She reports similar symptoms in the past and was diagnosed with bacterial vaginosis and treated accordingly.  She denies fever, chills, nausea, vomiting, abdominal or pelvic pain, urinary symptoms, vaginal itching, vaginal odor, vaginal bleeding, dyspareunia, vaginal rashes or lesions.    The history is provided by the patient. No language interpreter was used.    Past Medical History:  Diagnosis Date  . Miscarriage   . Panic attack   . Vaginal Pap smear, abnormal     Patient Active Problem List   Diagnosis Date Noted  . BV (bacterial vaginosis) 10/30/2018  . Vaginal itching 10/30/2018  . Vaginal odor 10/30/2018  . S/P vaginal hysterectomy 08/02/2017  . 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 LSIL (low grade squamous intraepithelial lesion) on Pap smear 11/27/2010  . Status post LEEP (loop electrosurgical excision procedure) of cervix 11/27/2010    Past Surgical History:  Procedure Laterality Date  . HAND SURGERY    . LEEP    . VAGINAL HYSTERECTOMY N/A 08/02/2017   Procedure: HYSTERECTOMY VAGINAL;  Surgeon: Florian Buff, MD;  Location: [facility];  Service: Gynecology;  Laterality: N/A;  . WISDOM TOOTH EXTRACTION Bilateral     OB History    Gravida  5   Para  2   Term  2   Preterm      AB  3   Living  2     SAB  1   TAB      Ectopic      Multiple       Live Births               Home Medications    Prior to Admission medications   Medication Sig Start Date End Date Taking? Authorizing Provider  ALPRAZolam (XANAX) 0.25 MG tablet Take 1 tablet (0.25 mg total) by mouth 2 (two) times daily as needed for anxiety. 04/27/19   Evalee Jefferson, PA-C  azithromycin (ZITHROMAX Z-PAK) 250 MG tablet Take 1 tablet (250 mg total) by mouth daily. 500mg  PO day 1, then 250mg  PO days 205 05/05/19   Noemi Chapel, MD  butalbital-acetaminophen-caffeine (FIORICET) [phone] MG tablet Take 1 tablet by mouth every 6 (six) hours as needed for headache. 04/25/19 04/24/20  Virgel Manifold, MD  fluticasone (FLONASE) 50 MCG/ACT nasal spray Place 2 sprays into both nostrils daily. 04/25/19   Virgel Manifold, MD  metroNIDAZOLE (FLAGYL) 500 MG tablet Take 1 tablet (500 mg total) by mouth 2 (two) times daily. 05/28/19   Estill Dooms, NP  potassium chloride SA (KLOR-CON) 20 MEQ tablet Take 1 tablet (20 mEq total) by mouth 2 (two) times daily. 04/27/19   Evalee Jefferson, PA-C  tinidazole (TINDAMAX) 500 MG tablet Take 2,000 mg by mouth 2 (two) times daily. 04/26/19   [provider]    Family History Family History  Problem Relation Age of Onset  . Asthma Maternal Grandmother   . Hypertension Maternal Grandmother   . Asthma Mother   . Hypertension Mother   . Asthma Brother   . Asthma Daughter     Social History Social History   Tobacco Use  . Smoking status: Current Some Day Smoker    Packs/day: 0.25    Years: 1.00    Pack years: 0.25    Types: Cigarettes  . Smokeless tobacco: Never Used  Substance Use Topics  . Alcohol use: Not Currently    Comment: occassional  . Drug use: No     Allergies   Latex   Review of Systems Review of Systems  Constitutional: Negative.   Respiratory: Negative.   Cardiovascular: Negative.   Gastrointestinal: Negative.   Genitourinary: Positive for vaginal discharge.       Vaginal odor  All other systems  reviewed and are negative.    Physical Exam Triage Vital Signs ED Triage Vitals  Enc Vitals Group     BP 06/25/19 1816 123/87     Pulse Rate 06/25/19 1816 88     Resp 06/25/19 1816 16     Temp 06/25/19 1816 98.6 F (37 C)     Temp Source 06/25/19 1816 Oral     SpO2 06/25/19 1816 97 %     Weight --      Height --      Head Circumference --      Peak Flow --      Pain Score 06/25/19 1814 0     Pain Loc --      Pain Edu? --      Excl. in GC? --    No data found.  Updated Vital Signs BP 123/87 (BP Location: Right Arm)   Pulse 88   Temp 98.6 F (37 C) (Oral)   Resp 16   LMP 07/23/2017   SpO2 97%   Visual Acuity Right Eye Distance:   Left Eye Distance:   Bilateral Distance:    Right Eye Near:   Left Eye Near:    Bilateral Near:     Physical Exam Vitals and nursing note reviewed.  Constitutional:      General: She is not in acute distress.    Appearance: Normal appearance. She is normal weight. She is not ill-appearing, toxic-appearing or diaphoretic.  Cardiovascular:     Rate and Rhythm: Normal rate and regular rhythm.     Pulses: Normal pulses.     Heart sounds: Normal heart sounds. No murmur.  Pulmonary:     Effort: Pulmonary effort is normal. No respiratory distress.     Breath sounds: Normal breath sounds. No stridor. No wheezing, rhonchi or rales.  Chest:     Chest wall: No tenderness.  Abdominal:     General: Abdomen is flat. Bowel sounds are normal. There is no distension.     Palpations: There is no mass.     Tenderness: There is no abdominal tenderness. There is no right CVA tenderness, left CVA tenderness, guarding or rebound.     Hernia: No hernia is present.  Neurological:     Mental Status: She is alert.      UC Treatments / Results  Labs (all labs ordered are listed, but only abnormal results are displayed) Labs Reviewed  CERVICOVAGINAL ANCILLARY ONLY    EKG   Radiology No results found.  Procedures Procedures (including  critical care time)  Medications Ordered in UC Medications  metroNIDAZOLE (FLAGYL)  tablet 2,000 mg (has no administration in time range)    Initial Impression / Assessment and Plan / UC Course  I have reviewed the triage vital signs and the nursing notes.  Pertinent labs & imaging results that were available during my care of the patient were reviewed by me and considered in my medical decision making (see chart for details).   Will treat patient for possible trichomoniasis Metronidazole 2 g was given in office To practice safe sex  Final Clinical Impressions(s) / UC Diagnoses   Final diagnoses:  STD exposure     Discharge Instructions     vaginal self swab/ cervical swab obtained Someone will call with abnormal result Metronidazole 2 g given in office for possible trichomonas (do not take while consuming alcohol) We will follow up with you regarding the results of your test If tests are positive, please abstain from sexual activity for at least 7 days and notify partners Follow up with PCP if symptoms persists Return here or go to ER if you have any new or worsening symptoms       ED Prescriptions    None     PDMP not reviewed this encounter.   Durward Parcel, FNP 06/25/19 1835

## 2019-06-25 NOTE — ED Triage Notes (Addendum)
Pt presents to UC w/ c/o vaginal itching, odor and discharge x2 days ago. Hx of BV.

## 2019-06-27 LAB — CERVICOVAGINAL ANCILLARY ONLY
Bacterial vaginitis: POSITIVE — AB
Candida vaginitis: POSITIVE — AB
Chlamydia: NEGATIVE
Neisseria Gonorrhea: NEGATIVE
Trichomonas: NEGATIVE

## 2019-06-28 ENCOUNTER — Telehealth (HOSPITAL_COMMUNITY): Payer: Self-pay | Admitting: Emergency Medicine

## 2019-06-28 MED ORDER — FLUCONAZOLE 150 MG PO TABS: 150.0000 mg | ORAL_TABLET | Freq: Once | ORAL | 0 refills | Status: AC

## 2019-06-28 NOTE — Telephone Encounter (Signed)
Pt was given 2g treatment, pt no symptoms, requesting treatment for yeast. Sent to pharmacy on record. All questions answered.

## 2019-07-01 ENCOUNTER — Other Ambulatory Visit: Payer: Medicaid Other

## 2019-07-02 MED ORDER — METRONIDAZOLE 500 MG PO TABS: 500.0000 mg | ORAL_TABLET | Freq: Two times a day (BID) | ORAL | 0 refills | Status: DC

## 2019-07-25 ENCOUNTER — Other Ambulatory Visit: Payer: Self-pay | Admitting: Adult Health

## 2019-07-25 ENCOUNTER — Telehealth (INDEPENDENT_AMBULATORY_CARE_PROVIDER_SITE_OTHER): Payer: Self-pay

## 2019-07-25 NOTE — Telephone Encounter (Signed)
I did not see this patient anywhere,no idea why the refill request is being sent to [place].Please disregard/refuse refill

## 2019-10-23 IMAGING — US US OB COMP LESS 14 WK
1 series · 13 of 28 positions shown · non-contrast
Comparison: Ultrasound 05/14/2012.

CLINICAL DATA: Vaginal bleeding.

EXAM:
OBSTETRIC <14 WK US AND TRANSVAGINAL OB US
TECHNIQUE: Both transabdominal and transvaginal ultrasound examinations were
performed for complete evaluation of the gestation as well as the
maternal uterus, adnexal regions, and pelvic cul-de-sac.
Transvaginal technique was performed to assess early pregnancy.

[Series 1: us ob comp less 14 wk · 0.22mm/px · 13 of 93 slices shown]
[im 4/93]
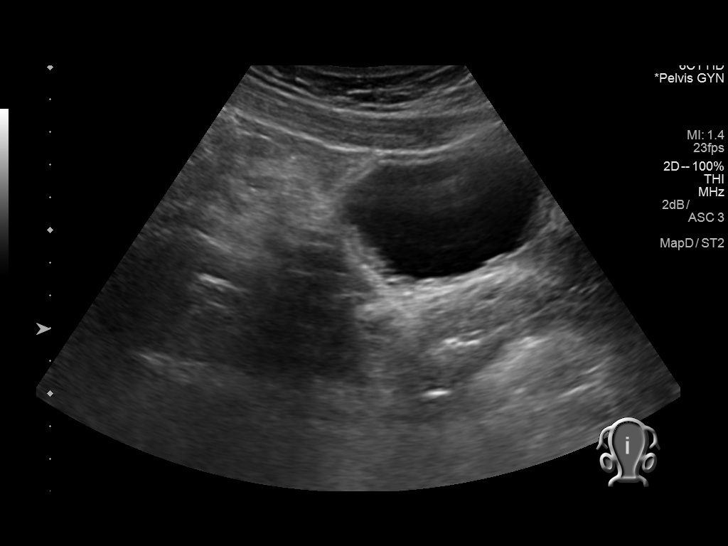
[im 11/93]
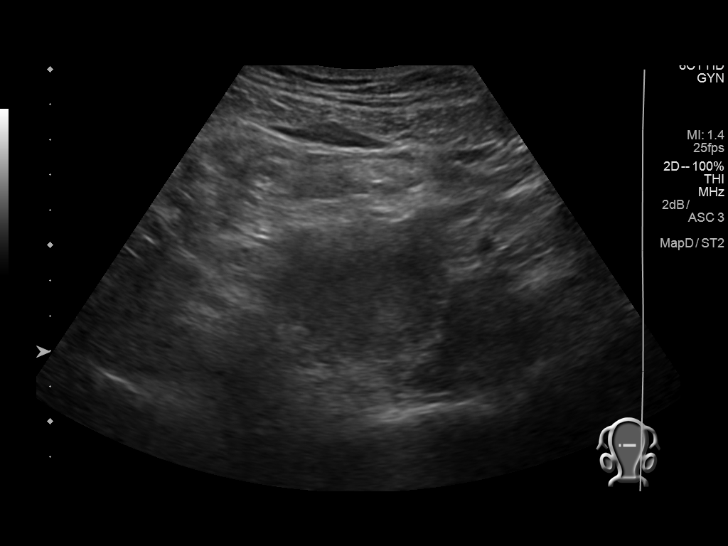
[im 18/93]
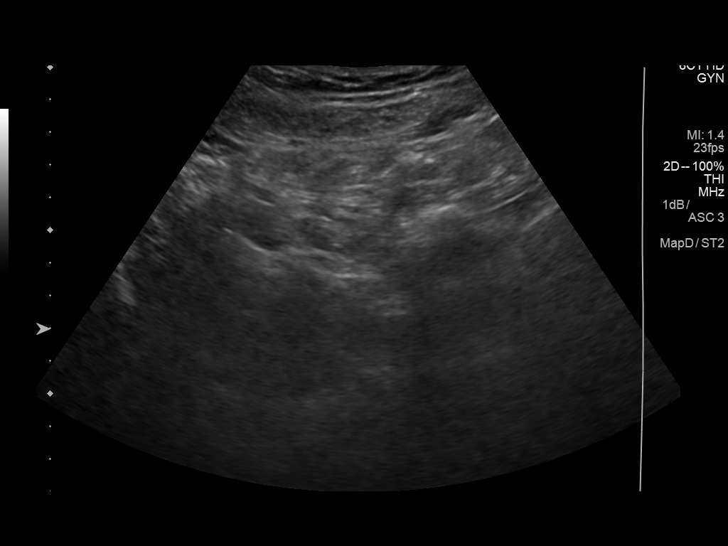
[im 24/93]
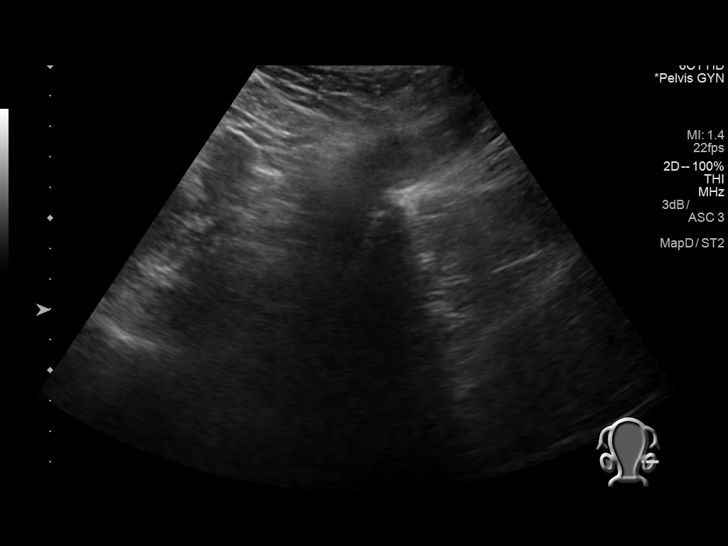
[im 31/93]
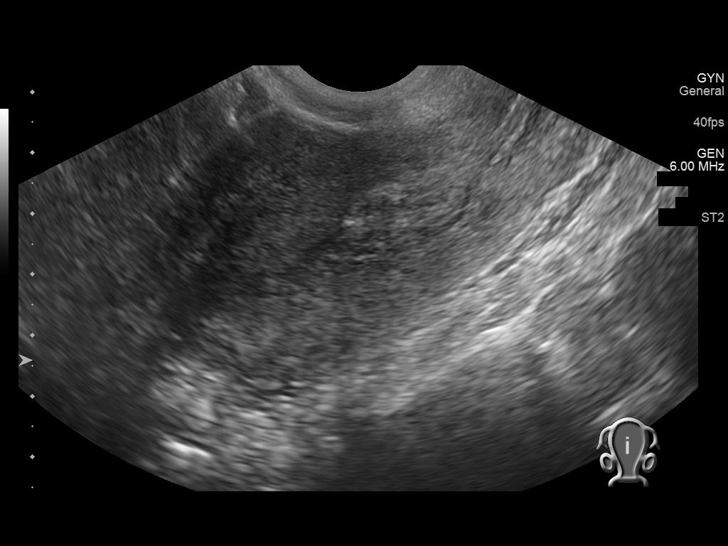
[im 38/93]
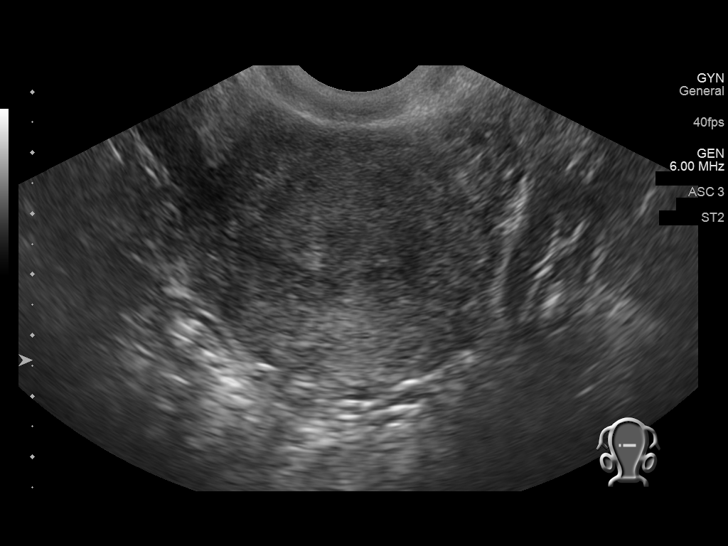
[im 48/93]
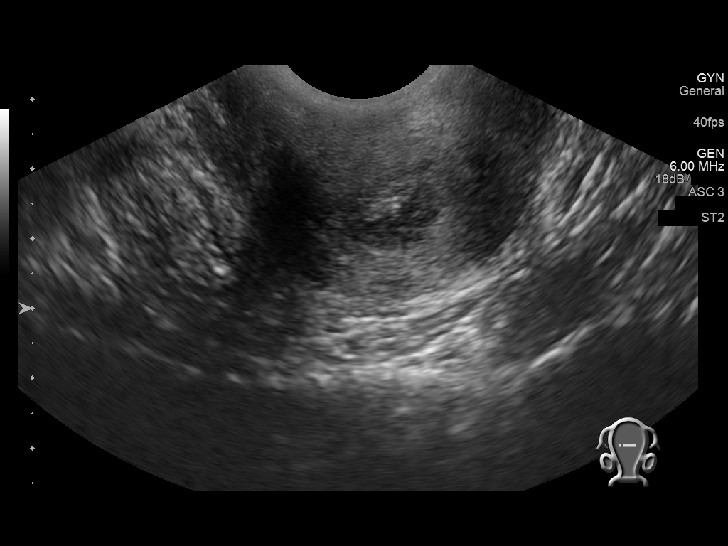
[im 55/93]
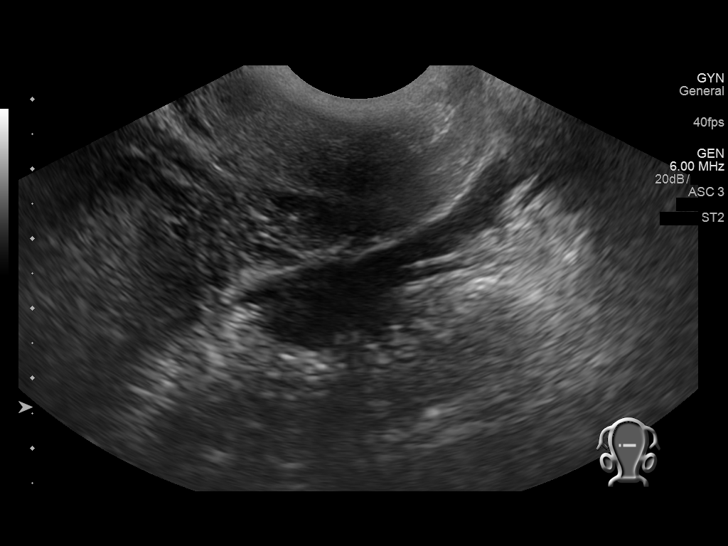
[im 62/93]
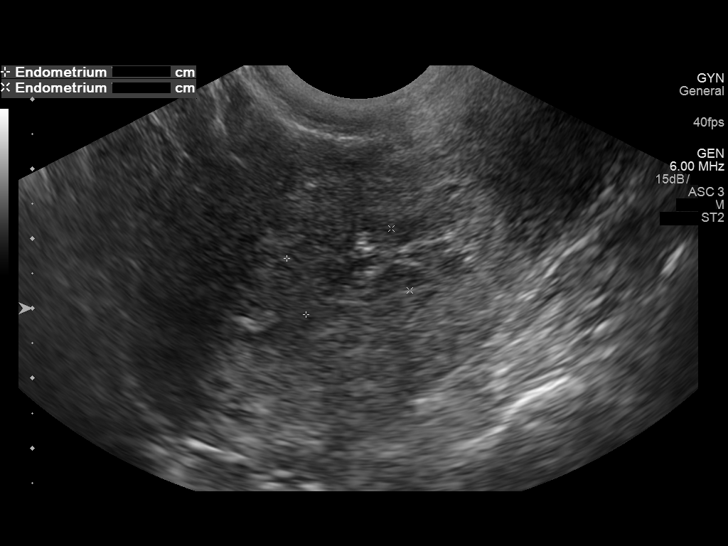
[im 69/93]
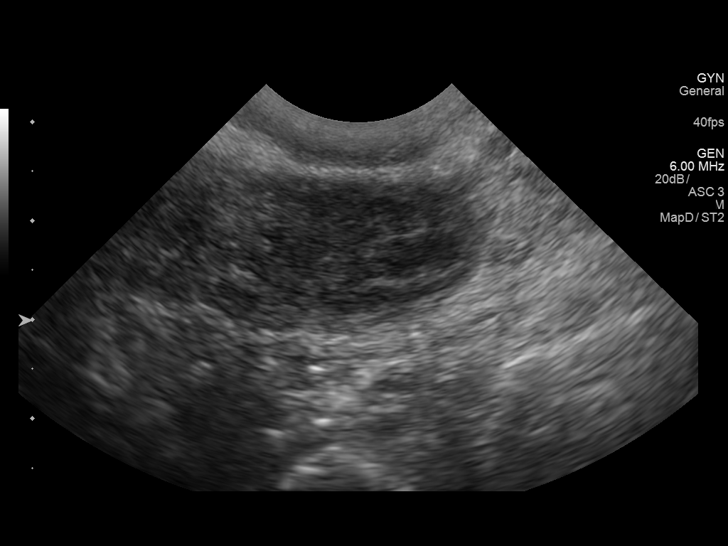
[im 75/93]
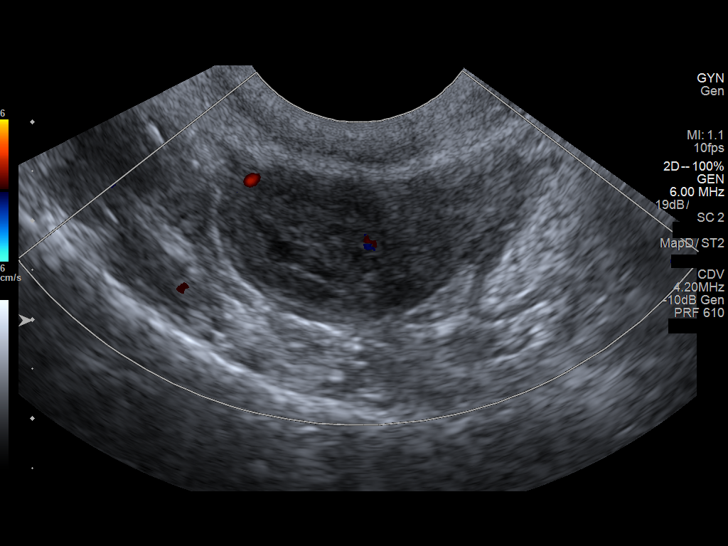
[im 82/93]
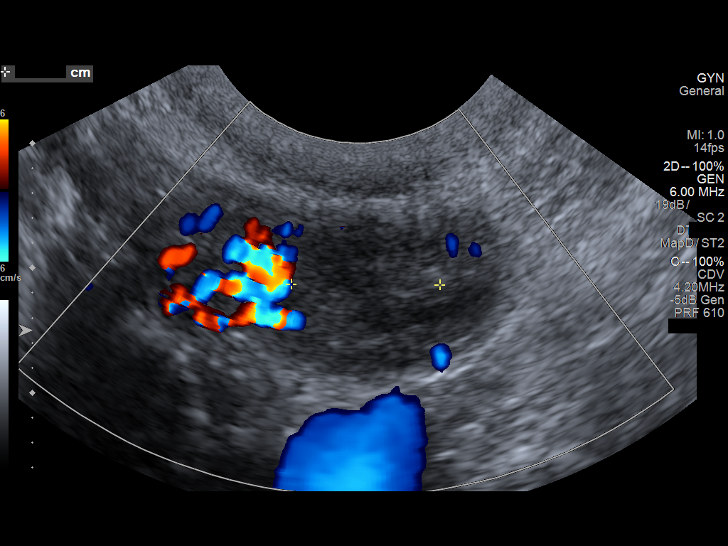
[im 89/93]
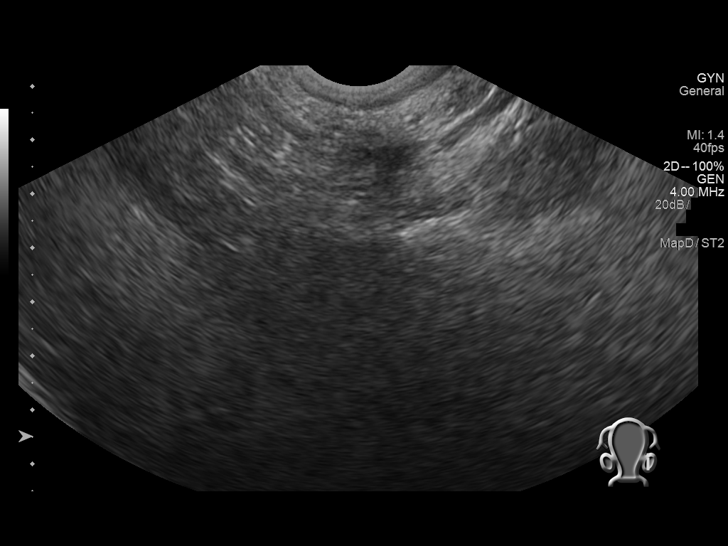

[13 of 28 positions shown; findings below may reference images not displayed]

FINDINGS: Intrauterine gestational sac: None visualized

Yolk sac:  None visualized

Embryo:  None visualized

Cardiac Activity: None visualized

Subchorionic hemorrhage:  None visualized.

Maternal uterus/adnexae: Endometrium measures 9 mm in thickness and
is slightly heterogeneous, follow-up exam can be obtained to
demonstrate resolution. 1.3 cm heterogeneous focus with noted within
the right ovary, possibly corpus luteal cyst. Pregnancy test
suggested to exclude ectopic pregnancy. Left ovary not visualized
due to overlying bowel gas. Trace free pelvic fluid.
IMPRESSION: 1. No intrauterine pregnancy noted. Findings are suspicious but not
yet definitive for failed pregnancy. Recommend follow-up US in 10-14
days for definitive diagnosis. This recommendation follows SRU
consensus guidelines: Diagnostic Criteria for Nonviable Pregnancy
Early in the First Trimester. N Engl J Med 6465; [DATE].

2. Endometrium is slightly heterogeneous in appearance, follow-up
exam can be obtained to demonstrate resolution. 1.3 cm heterogeneous
focus in the right ovary, possibly corpus luteal cyst. Pregnancy
test suggested to exclude ectopic pregnancy. This can also be
followed with ultrasound to demonstrate resolution.

## 2019-11-09 ENCOUNTER — Other Ambulatory Visit: Payer: Self-pay | Admitting: Adult Health

## 2020-05-30 ENCOUNTER — Encounter: Payer: Self-pay | Admitting: Emergency Medicine

## 2020-05-30 ENCOUNTER — Ambulatory Visit
Admission: EM | Admit: 2020-05-30 | Discharge: 2020-05-30 | Disposition: A | Discharge status: Home / Self Care | Payer: Medicaid Other | Attending: Internal Medicine | Admitting: Internal Medicine

## 2020-05-30 ENCOUNTER — Other Ambulatory Visit: Payer: Self-pay

## 2020-05-30 DIAGNOSIS — N898 Other specified noninflammatory disorders of vagina: Secondary | ICD-10-CM | POA: Diagnosis present

## 2020-05-30 MED ORDER — NYSTATIN 100000 UNIT/GM EX CREA: TOPICAL_CREAM | CUTANEOUS | 0 refills | Status: DC

## 2020-05-30 NOTE — ED Provider Notes (Signed)
[facility]    CSN: [id number] Arrival date & time: 05/30/20  1142      History   Chief Complaint Chief Complaint  Patient presents with  . Vaginal Itching    HPI Lisa Hanna is a 35 y.o. female who presents with L lower labia irritation and itching. Denies abnormal viginal discharge. Has not been on antibiotics in the past month. Denies dysuria.   Past Medical History:  Diagnosis Date  . Miscarriage   . Panic attack   . Vaginal Pap smear, abnormal     Patient Active Problem List   Diagnosis Date Noted  . BV (bacterial vaginosis) 10/30/2018  . Vaginal itching 10/30/2018  . Vaginal odor 10/30/2018  . S/P vaginal hysterectomy 08/02/2017  . 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 LSIL (low grade squamous intraepithelial lesion) on Pap smear 11/27/2010  . Status post LEEP (loop electrosurgical excision procedure) of cervix 11/27/2010    Past Surgical History:  Procedure Laterality Date  . HAND SURGERY    . LEEP    . VAGINAL HYSTERECTOMY N/A 08/02/2017   Procedure: HYSTERECTOMY VAGINAL;  Surgeon: Florian Buff, MD;  Location: [facility];  Service: Gynecology;  Laterality: N/A;  . WISDOM TOOTH EXTRACTION Bilateral     OB History    Gravida  5   Para  2   Term  2   Preterm      AB  3   Living  2     SAB  1   IAB      Ectopic      Multiple      Live Births               Home Medications    Prior to Admission medications   Medication Sig Start Date End Date Taking? Authorizing Provider  nystatin cream (MYCOSTATIN) Apply to affected area 2 times daily x 7 days 05/30/20  Yes Rodriguez-Southworth, Sunday Spillers, PA-C  ALPRAZolam (XANAX) 0.25 MG tablet Take 1 tablet (0.25 mg total) by mouth 2 (two) times daily as needed for anxiety. 04/27/19   Evalee Jefferson, PA-C  fluticasone (FLONASE) 50 MCG/ACT nasal spray Place 2 sprays into both nostrils daily. 04/25/19   Virgel Manifold, MD  metroNIDAZOLE (FLAGYL) 500 MG tablet TAKE  1 TABLET(500 MG) BY MOUTH TWICE DAILY 07/25/19   Derrek Monaco A, NP  metroNIDAZOLE (METROGEL) 0.75 % vaginal gel USE VAGINALLY EVERY NIGHT FOR 5 NIGHTS 11/11/19   Derrek Monaco A, NP  potassium chloride SA (KLOR-CON) 20 MEQ tablet Take 1 tablet (20 mEq total) by mouth 2 (two) times daily. 04/27/19   Evalee Jefferson, PA-C    Family History Family History  Problem Relation Age of Onset  . Asthma Maternal Grandmother   . Hypertension Maternal Grandmother   . Asthma Mother   . Hypertension Mother   . Asthma Brother   . Asthma Daughter     Social History Social History   Tobacco Use  . Smoking status: Current Some Day Smoker    Packs/day: 0.25    Years: 1.00    Pack years: 0.25    Types: Cigarettes  . Smokeless tobacco: Never Used  Vaping Use  . Vaping Use: Never used  Substance Use Topics  . Alcohol use: Not Currently    Comment: occassional  . Drug use: No     Allergies   Latex   Review of Systems Review of Systems  Gastrointestinal: Negative for abdominal pain.  Genitourinary: Negative for  vaginal discharge and vaginal pain.       Labia itching  Skin: Negative for rash.     Physical Exam Triage Vital Signs ED Triage Vitals  Enc Vitals Group     BP 05/30/20 1236 125/87     Pulse Rate 05/30/20 1236 94     Resp 05/30/20 1236 18     Temp 05/30/20 1236 98.4 F (36.9 C)     Temp Source 05/30/20 1236 Oral     SpO2 05/30/20 1236 96 %     Weight 05/30/20 1234 171 lb 15.3 oz (78 kg)     Height 05/30/20 1234 5\' 6"  (1.676 m)     Head Circumference --      Peak Flow --      Pain Score 05/30/20 1234 0     Pain Loc --      Pain Edu? --      Excl. in GC? --    No data found.  Updated Vital Signs BP 125/87 (BP Location: Right Arm)   Pulse 94   Temp 98.4 F (36.9 C) (Oral)   Resp 18   Ht 5\' 6"  (1.676 m)   Wt 171 lb 15.3 oz (78 kg)   LMP 07/23/2017   SpO2 96%   BMI 27.75 kg/m   Visual Acuity Right Eye Distance:   Left Eye Distance:   Bilateral  Distance:    Right Eye Near:   Left Eye Near:    Bilateral Near:     Physical Exam Vitals and nursing note reviewed.  Constitutional:      General: She is not in acute distress.    Appearance: She is obese. She is not toxic-appearing.  Eyes:     General: No scleral icterus.    Conjunctiva/sclera: Conjunctivae normal.  Pulmonary:     Effort: Pulmonary effort is normal.  Genitourinary:    General: Normal vulva.  Musculoskeletal:     Cervical back: Neck supple.  Neurological:     Mental Status: She is oriented to person, place, and time.     Gait: Gait normal.  Psychiatric:        Mood and Affect: Mood normal.        Behavior: Behavior normal.        Thought Content: Thought content normal.        Judgment: Judgment normal.     UC Treatments / Results  Labs (all labs ordered are listed, but only abnormal results are displayed) Labs Reviewed  CERVICOVAGINAL ANCILLARY ONLY    EKG   Radiology No results found.  Procedures Procedures (including critical care time)  Medications Ordered in UC Medications - No data to display  Initial Impression / Assessment and Plan / UC Course  I have reviewed the triage vital signs and the nursing notes. L labia itching could be from mild early candida. I will have her try mycostatin cream bid x 7 days  Final Clinical Impressions(s) / UC Diagnoses   Final diagnoses:  Vaginal itching     Discharge Instructions     The area you are itching looks normal, perhaps you have early local yeast infection, so try the topical cream I prescribed. Check on Mychart for your swab results which wont be back for 3-4 days.     ED Prescriptions    Medication Sig Dispense Auth. Provider   nystatin cream (MYCOSTATIN) Apply to affected area 2 times daily x 7 days 30 g Rodriguez-Southworth, , PA-C     PDMP  not reviewed this encounter.   Garey Ham, Cordelia Poche 05/30/20 2221

## 2020-05-30 NOTE — ED Triage Notes (Signed)
Vaginal irritation that started yesterday

## 2020-05-30 NOTE — Discharge Instructions (Signed)
The area you are itching looks normal, perhaps you have early local yeast infection, so try the topical cream I prescribed. Check on Mychart for your swab results which wont be back for 3-4 days.

## 2020-06-01 LAB — CERVICOVAGINAL ANCILLARY ONLY
Bacterial Vaginitis (gardnerella): NEGATIVE
Candida Glabrata: NEGATIVE
Candida Vaginitis: NEGATIVE
Chlamydia: NEGATIVE
Comment: NEGATIVE
Comment: NEGATIVE
Comment: NEGATIVE
Comment: NEGATIVE
Comment: NEGATIVE
Comment: NORMAL
Neisseria Gonorrhea: NEGATIVE
Trichomonas: NEGATIVE

## 2020-06-17 ENCOUNTER — Encounter (HOSPITAL_COMMUNITY): Payer: Self-pay

## 2020-06-17 ENCOUNTER — Emergency Department (HOSPITAL_COMMUNITY)
Admission: EM | Admit: 2020-06-17 | Discharge: 2020-06-17 | Disposition: A | Discharge status: Home / Self Care | Payer: Medicaid Other | Attending: Emergency Medicine | Admitting: Emergency Medicine

## 2020-06-17 ENCOUNTER — Other Ambulatory Visit: Payer: Self-pay

## 2020-06-17 DIAGNOSIS — F17210 Nicotine dependence, cigarettes, uncomplicated: Secondary | ICD-10-CM | POA: Diagnosis not present

## 2020-06-17 DIAGNOSIS — R1012 Left upper quadrant pain: Secondary | ICD-10-CM | POA: Insufficient documentation

## 2020-06-17 DIAGNOSIS — R251 Tremor, unspecified: Secondary | ICD-10-CM | POA: Insufficient documentation

## 2020-06-17 DIAGNOSIS — M79641 Pain in right hand: Secondary | ICD-10-CM | POA: Diagnosis not present

## 2020-06-17 DIAGNOSIS — R079 Chest pain, unspecified: Secondary | ICD-10-CM | POA: Insufficient documentation

## 2020-06-17 DIAGNOSIS — F419 Anxiety disorder, unspecified: Secondary | ICD-10-CM | POA: Diagnosis not present

## 2020-06-17 DIAGNOSIS — Z91040 Latex allergy status: Secondary | ICD-10-CM | POA: Diagnosis not present

## 2020-06-17 LAB — COMPREHENSIVE METABOLIC PANEL
ALT: 35 U/L (ref 0–44)
AST: 29 U/L (ref 15–41)
Albumin: 4.1 g/dL (ref 3.5–5.0)
Alkaline Phosphatase: 75 U/L (ref 38–126)
Anion gap: 9 (ref 5–15)
BUN: 10 mg/dL (ref 6–20)
CO2: 23 mmol/L (ref 22–32)
Calcium: 8.9 mg/dL (ref 8.9–10.3)
Chloride: 105 mmol/L (ref 98–111)
Creatinine, Ser: 0.84 mg/dL (ref 0.44–1.00)
GFR, Estimated: >60 mL/min (ref >60)
Glucose, Bld: 91 mg/dL (ref 70–99)
Potassium: 3.5 mmol/L (ref 3.5–5.1)
Sodium: 137 mmol/L (ref 135–145)
Total Bilirubin: 0.5 mg/dL (ref 0.3–1.2)
Total Protein: 7.1 g/dL (ref 6.5–8.1)

## 2020-06-17 LAB — CBC
HCT: 40.8 % (ref 36.0–46.0)
Hemoglobin: 13.1 g/dL (ref 12.0–15.0)
MCH: 34.7 pg — ABNORMAL HIGH (ref 26.0–34.0)
MCHC: 32.1 g/dL (ref 30.0–36.0)
MCV: 107.9 fL — ABNORMAL HIGH (ref 80.0–100.0)
Platelets: 418 10*3/uL — ABNORMAL HIGH (ref 150–400)
RBC: 3.78 MIL/uL — ABNORMAL LOW (ref 3.87–5.11)
RDW: 11.9 % (ref 11.5–15.5)
WBC: 8.1 10*3/uL (ref 4.0–10.5)
nRBC: 0 % (ref 0.0–0.2)

## 2020-06-17 LAB — LIPASE, BLOOD: Lipase: 25 U/L (ref 11–51)

## 2020-06-17 LAB — CK: Total CK: 55 U/L (ref 38–234)

## 2020-06-17 MED ORDER — HYDROXYZINE HCL 25 MG PO TABS: 25.0000 mg | ORAL_TABLET | Freq: Three times a day (TID) | ORAL | 0 refills | Status: DC | PRN

## 2020-06-17 MED ORDER — NAPROXEN 500 MG PO TABS: 500.0000 mg | ORAL_TABLET | Freq: Two times a day (BID) | ORAL | 0 refills | Status: DC

## 2020-06-17 NOTE — ED Provider Notes (Signed)
[facility] EMERGENCY DEPARTMENT Provider Note   CSN: [id number] Arrival date & time: 06/17/20  1512     History Chief Complaint  Patient presents with  . Abdominal Pain    Lisa Hanna is a 35 y.o. female presenting for evaluation of anxiety, LUQ/LL chest pain, and muscle cramping.   Pt states she was laying down when she started to have some discomfort in her L upper abd/LL chest. She became very anxious about this feeling, and asked to come to the ED. In the car ride, patient started hyperventilating, developed tingling, tremor, and cramping of her hands, right greater than left.  She reports a history of similar when she has had a panic attack.  She has been evaluated for anxiety in the past, started on Paxil, but has been off this for several years.  She does have a primary care doctor, but has not seen them recently.  She denies fevers, chills, chest pain, shortness of breath, cough, no pain with inspiration, nausea, vomiting, urinary symptoms, normal bowel movements.  She has been eating and drinking well.  She has been feeling well until earlier today.  There was no specific triggers to cause her symptoms today.  No sick contacts.  HPI     Past Medical History:  Diagnosis Date  . Miscarriage   . Panic attack   . Vaginal Pap smear, abnormal     Patient Active Problem List   Diagnosis Date Noted  . BV (bacterial vaginosis) 10/30/2018  . Vaginal itching 10/30/2018  . Vaginal odor 10/30/2018  . S/P vaginal hysterectomy 08/02/2017  . 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 LSIL (low grade squamous intraepithelial lesion) on Pap smear 11/27/2010  . Status post LEEP (loop electrosurgical excision procedure) of cervix 11/27/2010    Past Surgical History:  Procedure Laterality Date  . HAND SURGERY    . LEEP    . VAGINAL HYSTERECTOMY N/A 08/02/2017   Procedure: HYSTERECTOMY VAGINAL;  Surgeon: Lazaro Arms, MD;  Location: [facility];  Service:  Gynecology;  Laterality: N/A;  . WISDOM TOOTH EXTRACTION Bilateral      OB History    Gravida  5   Para  2   Term  2   Preterm      AB  3   Living  2     SAB  1   IAB      Ectopic      Multiple      Live Births              Family History  Problem Relation Age of Onset  . Asthma Maternal Grandmother   . Hypertension Maternal Grandmother   . Asthma Mother   . Hypertension Mother   . Asthma Brother   . Asthma Daughter     Social History   Tobacco Use  . Smoking status: Current Some Day Smoker    Packs/day: 0.25    Years: 1.00    Pack years: 0.25    Types: Cigarettes  . Smokeless tobacco: Never Used  Vaping Use  . Vaping Use: Never used  Substance Use Topics  . Alcohol use: Not Currently    Comment: occassional  . Drug use: No    Home Medications Prior to Admission medications   Medication Sig Start Date End Date Taking? Authorizing Provider  hydrOXYzine (ATARAX/VISTARIL) 25 MG tablet Take 1 tablet (25 mg total) by mouth every 8 (eight) hours as needed. 06/17/20  Yes Lennon Boutwell,  PA-C  naproxen (NAPROSYN) 500 MG tablet Take 1 tablet (500 mg total) by mouth 2 (two) times daily with a meal. 06/17/20  Yes Shaindy Reader, PA-C  ALPRAZolam (XANAX) 0.25 MG tablet Take 1 tablet (0.25 mg total) by mouth 2 (two) times daily as needed for anxiety. 04/27/19   Burgess Amor, PA-C  fluticasone (FLONASE) 50 MCG/ACT nasal spray Place 2 sprays into both nostrils daily. 04/25/19   Raeford Razor, MD  metroNIDAZOLE (FLAGYL) 500 MG tablet TAKE 1 TABLET(500 MG) BY MOUTH TWICE DAILY 07/25/19   Cyril Mourning A, NP  metroNIDAZOLE (METROGEL) 0.75 % vaginal gel USE VAGINALLY EVERY NIGHT FOR 5 NIGHTS 11/11/19   Adline Potter, NP  nystatin cream (MYCOSTATIN) Apply to affected area 2 times daily x 7 days 05/30/20   Rodriguez-Southworth, Nettie Elm, PA-C  potassium chloride SA (KLOR-CON) 20 MEQ tablet Take 1 tablet (20 mEq total) by mouth 2 (two) times daily. 04/27/19    Burgess Amor, PA-C    Allergies    Latex  Review of Systems   Review of Systems  Gastrointestinal:       LUQ/LL chest pain  Psychiatric/Behavioral: The patient is nervous/anxious.   All other systems reviewed and are negative.   Physical Exam Updated Vital Signs BP 121/82 (BP Location: Right Arm)   Pulse 87   Temp 98.4 F (36.9 C) (Oral)   Resp 18   Ht 5\' 6"  (1.676 m)   Wt 78 kg   LMP 07/23/2017   SpO2 100%   BMI 27.75 kg/m   Physical Exam Vitals and nursing note reviewed.  Constitutional:      General: She is not in acute distress.    Appearance: She is well-developed and well-nourished.     Comments: Appears nontoxic.  Resting comfortably.  HENT:     Head: Normocephalic and atraumatic.  Eyes:     Extraocular Movements: EOM normal.     Conjunctiva/sclera: Conjunctivae normal.     Pupils: Pupils are equal, round, and reactive to light.  Cardiovascular:     Rate and Rhythm: Normal rate and regular rhythm.     Pulses: Intact distal pulses.  Pulmonary:     Effort: Pulmonary effort is normal. No respiratory distress.     Breath sounds: Normal breath sounds. No wheezing.  Chest:     Chest wall: Tenderness present.       Comments: Mild tenderness palpation of left lower chest wall/left upper abdomen. Speaking full sentences.  Clear lung sounds in all fields.  No splinting or pain with inspiration. Abdominal:     General: There is no distension.     Palpations: Abdomen is soft. There is no mass.     Tenderness: There is no guarding or rebound.  Musculoskeletal:        General: Normal range of motion.     Cervical back: Normal range of motion and neck supple.     Right lower leg: No edema.     Left lower leg: No edema.  Skin:    General: Skin is warm and dry.     Capillary Refill: Capillary refill takes less than 2 seconds.  Neurological:     Mental Status: She is alert and oriented to person, place, and time.  Psychiatric:        Mood and Affect: Mood and  affect normal.     ED Results / Procedures / Treatments   Labs (all labs ordered are listed, but only abnormal results are displayed) Labs Reviewed  CBC -  Abnormal; Notable for the following components:      Result Value   RBC 3.78 (*)    MCV 107.9 (*)    MCH 34.7 (*)    Platelets 418 (*)    All other components within normal limits  LIPASE, BLOOD  COMPREHENSIVE METABOLIC PANEL  CK  URINALYSIS, ROUTINE W REFLEX MICROSCOPIC    EKG None  Radiology No results found.  Procedures Procedures (including critical care time)  Medications Ordered in ED Medications - No data to display  ED Course  I have reviewed the triage vital signs and the nursing notes.  Pertinent labs & imaging results that were available during my care of the patient were reviewed by me and considered in my medical decision making (see chart for details).    MDM Rules/Calculators/A&P                          Patient presented for evaluation of left-sided pain and cramping/tremors of the extremities.  On exam, patient peers nontoxic.  I saw her when she first arrived, she appeared extremely anxious, had tremors of both hands and cramping of the right.  This resolved while in the waiting room without intervention.  Patient states she is feeling better.  She continues to be mildly anxious.  Labs obtained from triage viewed interpreted by me, overall reassuring.  No electrolyte abnormalities.  CK is normal.  Exam shows mild tenderness palpation of the chest wall, favor MSK pain.  Exam is not consistent with ACS, PE, dissection, intra-abdominal infection.  I do not believe she needs further emergent imaging.  Discussed symptomatic management with NSAIDs and hydroxyzine as needed for anxiety.  Encourage patient to follow-up with her primary care doctor for further evaluation and management of her anxiety.  At this time, patient appears safe for discharged.  Return precautions given.  Patient states she understands and  agrees to plan.  Final Clinical Impression(s) / ED Diagnoses Final diagnoses:  Anxiety    Rx / DC Orders ED Discharge Orders         Ordered    naproxen (NAPROSYN) 500 MG tablet  2 times daily with meals        06/17/20 1934    hydrOXYzine (ATARAX/VISTARIL) 25 MG tablet  Every 8 hours PRN        06/17/20 1934           Alveria Apley, PA-C 06/17/20 2040    Gerhard Munch, MD 06/17/20 2317

## 2020-06-17 NOTE — Discharge Instructions (Signed)
Take naproxen 2 times a day with meals.  Do not take other anti-inflammatories at the same time (Advil, Motrin, ibuprofen, Aleve). You may supplement with Tylenol if you need further pain control. Use hydroxyzine as needed for anxiety or panic attacks. This may make you a little tired. It is important that you follow-up with your primary care doctor for further evaluation and management of your anxiety. There is inflammation in the paperwork about how to manage anxiety and some coping strategies. Make sure staying well-hydrated water. Return to the emergency room if you develop any new, worsening, concerning symptoms.

## 2020-06-17 NOTE — ED Provider Notes (Signed)
MSE was initiated and I personally evaluated the patient and placed orders (if any) at  3:28 PM on June 17, 2020.  Patient presented for evaluation of abdominal pain and right arm shaking and cramping.  Abdominal pain began 1 hour ago, patient became very anxious about it.  Since then, she has developed tremors and cramping of her right hand.  She also has tremors of her left hand, although no cramping.  She reports a history of similar with previous panic attacks.  She is not on anything for anxiety.  She has been eating and drinking well.  No numbness or tingling.  No trauma.  On exam, patient appears anxious.  She has slight tremor noted of both hands.  She has full active range of motion of the right hand, although at rest keeps fingers in a slightly flexed position.  Will obtain labs including electrolytes and CK from traige, although favor anxiety as cause of symptoms.  The patient appears stable so that the remainder of the MSE may be completed by another provider.   Alveria Apley, PA-C 06/17/20 1529    Gerhard Munch, MD 06/17/20 1753

## 2020-06-17 NOTE — ED Triage Notes (Signed)
Pt presents to ED with complaints of upper left abdominal pain started 1 hour ago. Pt denies N/V/D. Pt c/o right arm numbness and states her hand is froze up and cramping. Pt states she has had this happen before with panic attack.

## 2021-01-30 ENCOUNTER — Ambulatory Visit
Admission: EM | Admit: 2021-01-30 | Discharge: 2021-01-30 | Disposition: A | Discharge status: Home / Self Care | Payer: Medicaid Other | Attending: Internal Medicine | Admitting: Internal Medicine

## 2021-01-30 ENCOUNTER — Other Ambulatory Visit: Payer: Self-pay

## 2021-01-30 ENCOUNTER — Encounter: Payer: Self-pay | Admitting: Emergency Medicine

## 2021-01-30 DIAGNOSIS — M5432 Sciatica, left side: Secondary | ICD-10-CM

## 2021-01-30 MED ORDER — KETOROLAC TROMETHAMINE 30 MG/ML IJ SOLN
30.0000 mg | Freq: Once | INTRAMUSCULAR | Status: AC
  Administered 2021-01-30: 30 mg via INTRAMUSCULAR

## 2021-01-30 MED ORDER — IBUPROFEN 600 MG PO TABS: 600.0000 mg | ORAL_TABLET | Freq: Four times a day (QID) | ORAL | 0 refills | Status: DC | PRN

## 2021-01-30 MED ORDER — METHOCARBAMOL 500 MG PO TABS: 500.0000 mg | ORAL_TABLET | Freq: Every evening | ORAL | 0 refills | Status: DC | PRN

## 2021-01-30 NOTE — ED Provider Notes (Addendum)
[facility]    CSN: [id number] Arrival date & time: 01/30/21  0962      History   Chief Complaint No chief complaint on file.   HPI Lisa Hanna is a 35 y.o. female comes to the urgent care with low back pain.  Symptoms started last night and has been persistent.  Pain is currently 9 out of 10, sharp, aggravated by sitting from a lying position with no known relieving factors.  Pain radiates into the left leg.  Is associated with some numbness in both eyes.  No dysuria urgency or frequency.  No fall or trauma..  Patient used heating pad continuously for 3 hours yesterday with no improvement in pain.  HPI  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   LSIL (low grade squamous intraepithelial lesion) on Pap smear 11/27/2010   Status post LEEP (loop electrosurgical excision procedure) of cervix 11/27/2010    Past Surgical History:  Procedure Laterality Date   HAND SURGERY     LEEP     VAGINAL HYSTERECTOMY N/A 08/02/2017   Procedure: HYSTERECTOMY VAGINAL;  Surgeon: Lazaro Arms, MD;  Location: [facility];  Service: Gynecology;  Laterality: N/A;   WISDOM TOOTH EXTRACTION Bilateral     OB History     Gravida  5   Para  2   Term  2   Preterm      AB  3   Living  2      SAB  1   IAB      Ectopic      Multiple      Live Births               Home Medications    Prior to Admission medications   Medication Sig Start Date End Date Taking? Authorizing Provider  ibuprofen (ADVIL) 600 MG tablet Take 1 tablet (600 mg total) by mouth every 6 (six) hours as needed. 01/30/21  Yes Ayvah Caroll, Britta Mccreedy, MD  methocarbamol (ROBAXIN) 500 MG tablet  Take 1 tablet (500 mg total) by mouth at bedtime as needed for muscle spasms. 01/30/21  Yes Iver Miklas, Britta Mccreedy, MD  fluticasone (FLONASE) 50 MCG/ACT nasal spray Place 2 sprays into both nostrils daily. 04/25/19   Raeford Razor, MD  metroNIDAZOLE (METROGEL) 0.75 % vaginal gel USE VAGINALLY EVERY NIGHT FOR 5 NIGHTS 11/11/19   Cyril Mourning A, NP  nystatin cream (MYCOSTATIN) Apply to affected area 2 times daily x 7 days 05/30/20   Rodriguez-Southworth, Nettie Elm, PA-C    Family History Family History  Problem Relation Age of Onset   Asthma Maternal Grandmother    Hypertension Maternal Grandmother    Asthma Mother    Hypertension Mother    Asthma Brother    Asthma Daughter     Social History Social History   Tobacco Use   Smoking status: Some Days    Packs/day: 0.25    Years: 1.00    Pack years: 0.25    Types: Cigarettes   Smokeless tobacco: Never  Vaping Use   Vaping Use: Never used  Substance Use Topics   Alcohol use: Not Currently    Comment: occassional  Drug use: No     Allergies   Latex   Review of Systems Review of Systems  Gastrointestinal: Negative.   Musculoskeletal:  Positive for back pain. Negative for arthralgias.  Skin: Negative.   Neurological: Negative.     Physical Exam Triage Vital Signs ED Triage Vitals  Enc Vitals Group     BP 01/30/21 0924 122/82     Pulse Rate 01/30/21 0924 90     Resp 01/30/21 0924 16     Temp 01/30/21 0924 98.1 F (36.7 C)     Temp Source 01/30/21 0924 Oral     SpO2 01/30/21 0924 97 %     Weight --      Height --      Head Circumference --      Peak Flow --      Pain Score 01/30/21 0925 8     Pain Loc --      Pain Edu? --      Excl. in GC? --    No data found.  Updated Vital Signs BP 122/82 (BP Location: Right Arm)   Pulse 90   Temp 98.1 F (36.7 C) (Oral)   Resp 16   LMP 07/23/2017   SpO2 97%   Visual Acuity Right Eye Distance:   Left Eye Distance:   Bilateral Distance:    Right Eye Near:   Left  Eye Near:    Bilateral Near:     Physical Exam Vitals and nursing note reviewed.  Constitutional:      General: She is in acute distress.     Appearance: She is not ill-appearing.  Cardiovascular:     Rate and Rhythm: Normal rate and regular rhythm.  Musculoskeletal:        General: No swelling or tenderness. Normal range of motion.     Comments: Straight leg raise test is positive on the left side.  Neurological:     Mental Status: She is alert.     UC Treatments / Results  Labs (all labs ordered are listed, but only abnormal results are displayed) Labs Reviewed - No data to display  EKG   Radiology No results found.  Procedures Procedures (including critical care time)  Medications Ordered in UC Medications  ketorolac (TORADOL) 30 MG/ML injection 30 mg (30 mg Intramuscular Given 01/30/21 0951)    Initial Impression / Assessment and Plan / UC Course  I have reviewed the triage vital signs and the nursing notes.  Pertinent labs & imaging results that were available during my care of the patient were reviewed by me and considered in my medical decision making (see chart for details).     1.  Sciatica of the left side: Gentle range of motion exercises Toradol 30 mg IM x1 dose Ibuprofen every 6 hours as needed for pain Robaxin at bedtime as needed for muscle spasms or lower back stiffness-precautions given. No indication for x-rays at this time Return to urgent care if symptoms worsen. Final Clinical Impressions(s) / UC Diagnoses   Final diagnoses:  Sciatica of left side     Discharge Instructions      range of motion exercises Heating pad use at the 15-minute on-15 minutes off cycle . Take medications as prescribed Do not drive or operate machinery after you take Robaxin.  You may experience some drowsiness after you take Robaxin If symptoms worsen please return to urgent care No indication for x-ray at this time.   ED Prescriptions     Medication  Sig Dispense Auth. Provider   ibuprofen (ADVIL) 600 MG tablet Take 1 tablet (600 mg total) by mouth every 6 (six) hours as needed. 30 tablet Amarianna Abplanalp, Britta Mccreedy, MD   methocarbamol (ROBAXIN) 500 MG tablet Take 1 tablet (500 mg total) by mouth at bedtime as needed for muscle spasms. 20 tablet Kishawn Pickar, Britta Mccreedy, MD      PDMP not reviewed this encounter.   Merrilee Jansky, MD 01/30/21 1000    Merrilee Jansky, MD 01/30/21 1001

## 2021-01-30 NOTE — Discharge Instructions (Addendum)
range of motion exercises Heating pad use at the 15-minute on-15 minutes off cycle . Take medications as prescribed Do not drive or operate machinery after you take Robaxin.  You may experience some drowsiness after you take Robaxin If symptoms worsen please return to urgent care No indication for x-ray at this time.

## 2021-01-30 NOTE — ED Triage Notes (Signed)
Lower back pain with pain in hips and radiating down both legs

## 2021-02-08 ENCOUNTER — Other Ambulatory Visit: Payer: Self-pay

## 2021-02-08 ENCOUNTER — Encounter (HOSPITAL_COMMUNITY): Payer: Self-pay | Admitting: Emergency Medicine

## 2021-02-08 ENCOUNTER — Emergency Department (HOSPITAL_COMMUNITY): Payer: Medicaid Other

## 2021-02-08 ENCOUNTER — Emergency Department (HOSPITAL_COMMUNITY)
Admission: EM | Admit: 2021-02-08 | Discharge: 2021-02-08 | Disposition: A | Discharge status: Home / Self Care | Payer: Medicaid Other | Attending: Emergency Medicine | Admitting: Emergency Medicine

## 2021-02-08 DIAGNOSIS — F17210 Nicotine dependence, cigarettes, uncomplicated: Secondary | ICD-10-CM | POA: Insufficient documentation

## 2021-02-08 DIAGNOSIS — Z91040 Latex allergy status: Secondary | ICD-10-CM | POA: Insufficient documentation

## 2021-02-08 DIAGNOSIS — S6991XA Unspecified injury of right wrist, hand and finger(s), initial encounter: Secondary | ICD-10-CM | POA: Diagnosis present

## 2021-02-08 DIAGNOSIS — W231XXA Caught, crushed, jammed, or pinched between stationary objects, initial encounter: Secondary | ICD-10-CM | POA: Diagnosis not present

## 2021-02-08 DIAGNOSIS — S62521A Displaced fracture of distal phalanx of right thumb, initial encounter for closed fracture: Secondary | ICD-10-CM | POA: Diagnosis not present

## 2021-02-08 NOTE — ED Provider Notes (Addendum)
[facility] EMERGENCY DEPARTMENT Provider Note   CSN: [id number] Arrival date & time: 02/08/21  0202     History Chief Complaint  Patient presents with   Hand Pain    Lisa Hanna is a 35 y.o. female.  Patient presents to the emergency department for evaluation of right hand and arm pain.  Patient reports that she smashed her right thumb several days ago and has been having pain since.  Pain has been persistent and is now radiating up the arm.  She cannot move the thumb because it hurts.      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   LSIL (low grade squamous intraepithelial lesion) on Pap smear 11/27/2010   Status post LEEP (loop electrosurgical excision procedure) of cervix 11/27/2010    Past Surgical History:  Procedure Laterality Date   HAND SURGERY     LEEP     VAGINAL HYSTERECTOMY N/A 08/02/2017   Procedure: HYSTERECTOMY VAGINAL;  Surgeon: Lazaro Arms, MD;  Location: [facility];  Service: Gynecology;  Laterality: N/A;   WISDOM TOOTH EXTRACTION Bilateral      OB History     Gravida  5   Para  2   Term  2   Preterm      AB  3   Living  2      SAB  1   IAB      Ectopic      Multiple      Live Births              Family History  Problem Relation Age of Onset   Asthma Maternal Grandmother    Hypertension Maternal Grandmother    Asthma Mother    Hypertension Mother    Asthma Brother    Asthma Daughter     Social History   Tobacco Use   Smoking status: Some Days    Packs/day: 0.25    Years: 1.00    Pack years: 0.25    Types: Cigarettes   Smokeless tobacco: Never  Vaping Use   Vaping Use: Never used  Substance Use  Topics   Alcohol use: Not Currently    Comment: occassional   Drug use: No    Home Medications Prior to Admission medications   Medication Sig Start Date End Date Taking? Authorizing Provider  fluticasone (FLONASE) 50 MCG/ACT nasal spray Place 2 sprays into both nostrils daily. 04/25/19   Raeford Razor, MD  ibuprofen (ADVIL) 600 MG tablet Take 1 tablet (600 mg total) by mouth every 6 (six) hours as needed. 01/30/21   Lamptey, Britta Mccreedy, MD  methocarbamol (ROBAXIN) 500 MG tablet Take 1 tablet (500 mg total) by mouth at bedtime as needed for muscle spasms. 01/30/21   Merrilee Jansky, MD  metroNIDAZOLE (METROGEL) 0.75 % vaginal gel USE VAGINALLY EVERY NIGHT FOR 5 NIGHTS 11/11/19   Cyril Mourning A, NP  nystatin cream (MYCOSTATIN) Apply to affected area 2 times daily x 7 days 05/30/20   Rodriguez-Southworth, Nettie Elm, PA-C    Allergies    Latex  Review of Systems   Review of Systems  Musculoskeletal:  Positive for arthralgias.  Neurological:  Negative.    Physical Exam Updated Vital Signs BP 115/85 (BP Location: Right Arm)   Pulse 88   Temp 98.5 F (36.9 C) (Oral)   Resp 18   Ht 5\' 6"  (1.676 m)   Wt 81.6 kg   LMP 07/23/2017   SpO2 100%   BMI 29.05 kg/m   Physical Exam Vitals and nursing note reviewed.  HENT:     Head: Normocephalic.  Musculoskeletal:     Right hand: No swelling, deformity or lacerations. Decreased range of motion (Thumb).     Comments: Diffuse tenderness of the thumb without deformity or swelling.  No ligamentous instability.  Skin:    Findings: No abrasion, bruising, erythema or wound.  Neurological:     Mental Status: She is alert.     Sensory: Sensation is intact.     Motor: Motor function is intact.    ED Results / Procedures / Treatments   Labs (all labs ordered are listed, but only abnormal results are displayed) Labs Reviewed - No data to display  EKG None  Radiology DG Finger Thumb Right  Result Date: 02/08/2021 CLINICAL DATA:  Trauma to  the right thumb. EXAM: RIGHT THUMB 2+V COMPARISON:  None. FINDINGS: Minimally displaced intra-articular fracture the base of the distal phalanx of the thumb. No other acute fracture. There is no dislocation. The bones are well mineralized. No arthritic changes. Mild soft tissue swelling of the thumb. No radiopaque foreign object or soft tissue gas. IMPRESSION: Minimally displaced intra-articular fracture of the base of the distal phalanx of the thumb. Electronically Signed   By: 04/10/2021 M.D.   On: 02/08/2021 02:40    Procedures Procedures   SPLINT APPLICATION Date/Time: 3:35 AM Authorized by: 04/10/2021 Consent: Verbal consent obtained. Risks and benefits: risks, benefits and alternatives were discussed Consent given by: patient Splint applied by: nurse  Location details: right thumb Splint type: thumb spica Supplies used: orthoglass Post-procedure: The splinted body part was neurovascularly unchanged following the procedure. Patient tolerance: Patient tolerated the procedure well with no immediate complications.    Medications Ordered in ED Medications - No data to display  ED Course  I have reviewed the triage vital signs and the nursing notes.  Pertinent labs & imaging results that were available during my care of the patient were reviewed by me and considered in my medical decision making (see chart for details).    MDM Rules/Calculators/A&P                           Presents for right thumb injury which occurred several days ago.  Examination reveals tenderness without any swelling, deformity or other overt signs of injury.  X-ray shows fracture at the base of the distal phalanx.  Thumb spica, follow-up orthopedics.  Final Clinical Impression(s) / ED Diagnoses Final diagnoses:  Closed displaced fracture of distal phalanx of right thumb, initial encounter    Rx / DC Orders ED Discharge Orders     None        Taisia Fantini, Gilda Crease, MD 02/08/21  04/10/21    [id number], MD 02/08/21 [phone]

## 2021-02-08 NOTE — ED Triage Notes (Signed)
Pt c/o right hand/thumb pain after smashing it between dresser and wall this past Wednesday.

## 2021-06-28 DIAGNOSIS — Z1231 Encounter for screening mammogram for malignant neoplasm of breast: Secondary | ICD-10-CM

## 2021-08-13 ENCOUNTER — Other Ambulatory Visit: Payer: Self-pay

## 2021-08-13 ENCOUNTER — Encounter (HOSPITAL_COMMUNITY): Payer: Self-pay

## 2021-08-13 DIAGNOSIS — Z5321 Procedure and treatment not carried out due to patient leaving prior to being seen by health care provider: Secondary | ICD-10-CM | POA: Insufficient documentation

## 2021-08-13 DIAGNOSIS — R202 Paresthesia of skin: Secondary | ICD-10-CM | POA: Insufficient documentation

## 2021-08-13 NOTE — ED Triage Notes (Signed)
Pov from home. Cc of left hand tingling and difficulty taking a deep breath. Hx of panic attacks and thinks it might be that but is unsure.  ?

## 2021-08-14 ENCOUNTER — Emergency Department (HOSPITAL_COMMUNITY)
Admission: EM | Admit: 2021-08-14 | Discharge: 2021-08-14 | Disposition: A | Discharge status: Home / Self Care | Payer: Medicaid Other | Attending: Emergency Medicine | Admitting: Emergency Medicine

## 2021-08-14 NOTE — ED Notes (Signed)
Pt called x 3 with no answer- pt is not in lobby 

## 2021-10-28 ENCOUNTER — Ambulatory Visit
Admission: EM | Admit: 2021-10-28 | Discharge: 2021-10-28 | Disposition: A | Discharge status: Home / Self Care | Payer: Medicaid Other | Attending: Nurse Practitioner | Admitting: Nurse Practitioner

## 2021-10-28 ENCOUNTER — Encounter: Payer: Self-pay | Admitting: Emergency Medicine

## 2021-10-28 DIAGNOSIS — W57XXXA Bitten or stung by nonvenomous insect and other nonvenomous arthropods, initial encounter: Secondary | ICD-10-CM | POA: Diagnosis not present

## 2021-10-28 DIAGNOSIS — S80861A Insect bite (nonvenomous), right lower leg, initial encounter: Secondary | ICD-10-CM

## 2021-10-28 MED ORDER — MUPIROCIN 2 % EX OINT
1.0000 | TOPICAL_OINTMENT | Freq: Two times a day (BID) | CUTANEOUS | 0 refills | Status: DC
Start: 2021-10-28 — End: 2022-07-08

## 2021-10-28 NOTE — ED Provider Notes (Signed)
[facility]    CSN: [id number] Arrival date & time: 10/28/21  0908      History   Chief Complaint No chief complaint on file.   HPI Lisa Hanna is a 36 y.o. female.   The history is provided by the patient.   Patient presents with a "insect bite" to the back of her right leg.  Patient states she was not sure if she has been bitten by a spider or tick, but the injury occurred 2 days ago.  Since that time she has pain to the area, and states that the right leg is aching.  She denies fever, chills, generalized malaise, swelling, or drainage.  Patient states she has not used any medication for her symptoms.  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   LSIL (low grade squamous intraepithelial lesion) on Pap smear 11/27/2010   Status post LEEP (loop electrosurgical excision procedure) of cervix 11/27/2010    Past Surgical History:  Procedure Laterality Date   HAND SURGERY     LEEP     VAGINAL HYSTERECTOMY N/A 08/02/2017   Procedure: HYSTERECTOMY VAGINAL;  Surgeon: Florian Buff, MD;  Location: [facility];  Service: Gynecology;  Laterality: N/A;   WISDOM TOOTH EXTRACTION Bilateral     OB History     Gravida  5   Para  2   Term  2   Preterm      AB  3   Living  2      SAB  1   IAB      Ectopic      Multiple      Live Births               Home Medications    Prior to Admission medications   Medication Sig Start Date End Date Taking? Authorizing Provider  mupirocin ointment (BACTROBAN) 2 % Apply 1 application. topically 2 (two) times daily. 10/28/21  Yes Isais Klipfel-Warren, Alda Lea, NP  fluticasone (FLONASE) 50 MCG/ACT nasal spray  Place 2 sprays into both nostrils daily. 04/25/19   Virgel Manifold, MD  ibuprofen (ADVIL) 600 MG tablet Take 1 tablet (600 mg total) by mouth every 6 (six) hours as needed. 01/30/21   Lamptey, Myrene Galas, MD  methocarbamol (ROBAXIN) 500 MG tablet Take 1 tablet (500 mg total) by mouth at bedtime as needed for muscle spasms. 01/30/21   Chase Picket, MD  metroNIDAZOLE (METROGEL) 0.75 % vaginal gel USE VAGINALLY EVERY NIGHT FOR 5 NIGHTS 11/11/19   Derrek Monaco A, NP  nystatin cream (MYCOSTATIN) Apply to affected area 2 times daily x 7 days 05/30/20   Rodriguez-Southworth, Sunday Spillers, PA-C    Family History Family History  Problem Relation Age of Onset   Asthma Maternal Grandmother    Hypertension Maternal Grandmother    Asthma Mother    Hypertension Mother    Asthma Brother    Asthma Daughter     Social History Social History   Tobacco Use   Smoking status: Some Days    Packs/day: 0.25    Years: 1.00    Pack years: 0.25    Types: Cigarettes   Smokeless  tobacco: Never  Vaping Use   Vaping Use: Never used  Substance Use Topics   Alcohol use: Not Currently    Comment: occassional   Drug use: No     Allergies   Latex   Review of Systems Review of Systems Per HPI  Physical Exam Triage Vital Signs ED Triage Vitals  Enc Vitals Group     BP 10/28/21 0926 120/86     Pulse Rate 10/28/21 0926 87     Resp 10/28/21 0926 18     Temp 10/28/21 0926 98.1 F (36.7 C)     Temp Source 10/28/21 0926 Oral     SpO2 10/28/21 0926 96 %     Weight --      Height --      Head Circumference --      Peak Flow --      Pain Score 10/28/21 0927 3     Pain Loc --      Pain Edu? --      Excl. in [place]? --    No data found.  Updated Vital Signs BP 120/86 (BP Location: Right Arm)   Pulse 87   Temp 98.1 F (36.7 C) (Oral)   Resp 18   LMP 07/23/2017   SpO2 96%   Visual Acuity Right Eye Distance:   Left Eye Distance:   Bilateral Distance:    Right Eye Near:   Left Eye Near:     Bilateral Near:     Physical Exam Vitals and nursing note reviewed.  Constitutional:      General: She is not in acute distress.    Appearance: She is well-developed.  Cardiovascular:     Rate and Rhythm: Normal rate and regular rhythm.     Heart sounds: Normal heart sounds.  Pulmonary:     Effort: Pulmonary effort is normal.     Breath sounds: Normal breath sounds.  Abdominal:     General: Bowel sounds are normal. There is no distension.     Palpations: Abdomen is soft.     Tenderness: There is no abdominal tenderness. There is no guarding or rebound.  Genitourinary:    Vagina: Normal. No vaginal discharge.  Musculoskeletal:     Cervical back: Normal range of motion.  Skin:    General: Skin is warm and dry.     Findings: No erythema or rash.     Comments: Induration to the posterior right leg behind the knee.  Area measures approximately 1 cm x 1 cm and is surrounded by erythematous base.  Scabbing is noted over the central area of the induration.  No warmth, swelling, fluctuance, or drainage noted.  Neurological:     Mental Status: She is alert and oriented to person, place, and time.     Cranial Nerves: No cranial nerve deficit.  Psychiatric:        Behavior: Behavior normal.     UC Treatments / Results  Labs (all labs ordered are listed, but only abnormal results are displayed) Labs Reviewed - No data to display  EKG   Radiology No results found.  Procedures Procedures (including critical care time)  Medications Ordered in UC Medications - No data to display  Initial Impression / Assessment and Plan / UC Course  I have reviewed the triage vital signs and the nursing notes.  Pertinent labs & imaging results that were available during my care of the patient were reviewed by me and considered in my medical decision making (see chart  for details).  Patient presents for a "insect bite" to the back of her right leg.  Symptoms started 2 days ago.  On exam, the  patient has an area of induration that measures 1 cm x 1 cm with an erythematous base.  There is drainage, fluctuance noted.  Unable to determine the type of insect that caused the bite.  Patient is in no acute distress, her vital signs are stable.  There is no concern for systemic infection at this time.  We will treat patient with mupirocin.  Recommendations for supportive care were provided.  Patient advised to follow-up, strict return precautions were provided. Final Clinical Impressions(s) / UC Diagnoses   Final diagnoses:  Insect bite of right lower leg, initial encounter     Discharge Instructions      Apply medication as prescribed. May take ibuprofen or Tylenol for pain, fever, or general discomfort. If needed, recommend Epsom salt soaks to help with pain in the right lower leg. If you develop fever, chills, worsening redness, drainage, or other concerns, please follow-up in our office.     ED Prescriptions     Medication Sig Dispense Auth. Provider   mupirocin ointment (BACTROBAN) 2 % Apply 1 application. topically 2 (two) times daily. 22 g Praise Stennett-Warren, Alda Lea, NP      PDMP not reviewed this encounter.   Tish Men, NP 10/28/21 [phone]

## 2021-10-28 NOTE — ED Triage Notes (Signed)
Bite on back of right leg x 2 days.

## 2021-10-28 NOTE — Discharge Instructions (Signed)
Apply medication as prescribed. May take ibuprofen or Tylenol for pain, fever, or general discomfort. If needed, recommend Epsom salt soaks to help with pain in the right lower leg. If you develop fever, chills, worsening redness, drainage, or other concerns, please follow-up in our office.

## 2021-12-28 ENCOUNTER — Encounter: Payer: Self-pay | Admitting: Emergency Medicine

## 2021-12-28 ENCOUNTER — Ambulatory Visit (INDEPENDENT_AMBULATORY_CARE_PROVIDER_SITE_OTHER): Payer: Medicaid Other

## 2021-12-28 ENCOUNTER — Ambulatory Visit
Admission: EM | Admit: 2021-12-28 | Discharge: 2021-12-28 | Disposition: A | Discharge status: Home / Self Care | Payer: Medicaid Other | Attending: Nurse Practitioner | Admitting: Nurse Practitioner

## 2021-12-28 DIAGNOSIS — R109 Unspecified abdominal pain: Secondary | ICD-10-CM

## 2021-12-28 LAB — POCT URINALYSIS DIP (MANUAL ENTRY)
Bilirubin, UA: NEGATIVE
Blood, UA: NEGATIVE
Glucose, UA: NEGATIVE mg/dL
Ketones, POC UA: NEGATIVE mg/dL
Leukocytes, UA: NEGATIVE
Nitrite, UA: NEGATIVE
Protein Ur, POC: NEGATIVE mg/dL
Spec Grav, UA: >=1.03 — AB (ref 1.010–1.025)
Urobilinogen, UA: 0.2 E.U./dL
pH, UA: 6.5 (ref 5.0–8.0)

## 2021-12-28 MED ORDER — SENNA 8.6 MG PO TABS: 1.0000 | ORAL_TABLET | Freq: Every day | ORAL | 0 refills | Status: AC

## 2021-12-28 MED ORDER — SIMETHICONE 80 MG PO CHEW: 80.0000 mg | CHEWABLE_TABLET | Freq: Four times a day (QID) | ORAL | 0 refills | Status: DC | PRN

## 2021-12-28 NOTE — Discharge Instructions (Addendum)
Your x-rays and urinalysis are negative at this time. Take medication as prescribed.  I am prescribing medication to help with constipation and for gas and bloating. Increase fluids and allow for plenty of rest.  Continue drinking at least 8-10 8 ounce glasses of water daily. Continue eating at least 2-3 servings of vegetables daily. May take over-the-counter Tylenol as needed for pain or discomfort. Go to the emergency department immediately if you develop worsening abdominal pain, fever, chills, nausea, vomiting, or other concerns. Follow-up with your primary care physician if your symptoms fail to improve.

## 2021-12-28 NOTE — ED Triage Notes (Signed)
Right upper ABD pain since the weekend.  States pain comes and goes.  States her stomach feels full.  Last BM was yesterday and states it was small.

## 2021-12-28 NOTE — ED Provider Notes (Signed)
[name]    CSN: [id number] Arrival date & time: 12/28/21  1432      History   Chief Complaint No chief complaint on file.   HPI Lisa Hanna is a 36 y.o. female.   The history is provided by the patient.   Patient presents for complaints of right upper quadrant abdominal pain.  Patient has been present for the past 3 days.  Patient states pain "comes and goes".  She states that her last bowel movement was 1 day ago and states that it was "pellets" and that she had to strain.  She states this has been happening over the past couple of days.  She also reports history of constipation.  She denies fever, chills, nausea, vomiting, or diarrhea, urinary frequency, urgency, hesitancy, or hematuria.  She also denies back pain..  She also states that her abdomen feels "full" and that she feels bloated.  She does state that she thought she had gas initially.  She has not taken any medication for her symptoms.  Patient reports history of partial hysterectomy, last menstrual cycle was in 2019.  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   LSIL (low grade squamous intraepithelial lesion) on Pap smear 11/27/2010   Status post LEEP (loop electrosurgical excision procedure) of cervix 11/27/2010    Past Surgical History:  Procedure Laterality Date   HAND SURGERY     LEEP     VAGINAL HYSTERECTOMY N/A 08/02/2017   Procedure: HYSTERECTOMY VAGINAL;  Surgeon: Lazaro Arms, MD;  Location: [facility];  Service: Gynecology;  Laterality: N/A;   WISDOM TOOTH EXTRACTION Bilateral     OB History     Gravida  5   Para  2   Term  2   Preterm      AB  3   Living  2       SAB  1   IAB      Ectopic      Multiple      Live Births               Home Medications    Prior to Admission medications   Medication Sig Start Date End Date Taking? Authorizing Provider  senna (SENOKOT) 8.6 MG TABS tablet Take 1 tablet (8.6 mg total) by mouth at bedtime. 12/28/21 01/27/22 Yes Talar Fraley-Warren, Sadie Haber, NP  simethicone (GAS-X) 80 MG chewable tablet Chew 1 tablet (80 mg total) by mouth every 6 (six) hours as needed for flatulence. 12/28/21  Yes Shayna Eblen-Warren, Sadie Haber, NP  fluticasone (FLONASE) 50 MCG/ACT nasal spray Place 2 sprays into both nostrils daily. 04/25/19   Raeford Razor, MD  ibuprofen (ADVIL) 600 MG tablet Take 1 tablet (600 mg total) by mouth every 6 (six) hours as needed. 01/30/21   Lamptey, Britta Mccreedy, MD  methocarbamol (ROBAXIN) 500 MG tablet Take 1 tablet (500 mg total) by mouth at bedtime as needed for muscle spasms. 01/30/21   Merrilee Jansky, MD  metroNIDAZOLE (METROGEL) 0.75 % vaginal gel USE VAGINALLY EVERY NIGHT FOR 5 NIGHTS 11/11/19   Adline Potter, NP  mupirocin ointment (BACTROBAN) 2 % Apply 1 application.  topically 2 (two) times daily. 10/28/21   Scheryl Sanborn-Warren, Sadie Haber, NP  nystatin cream (MYCOSTATIN) Apply to affected area 2 times daily x 7 days 05/30/20   Rodriguez-Southworth, Nettie Elm, PA-C    Family History Family History  Problem Relation Age of Onset   Asthma Maternal Grandmother    Hypertension Maternal Grandmother    Asthma Mother    Hypertension Mother    Asthma Brother    Asthma Daughter     Social History Social History   Tobacco Use   Smoking status: Some Days    Packs/day: 0.25    Years: 1.00    Total pack years: 0.25    Types: Cigarettes   Smokeless tobacco: Never  Vaping Use   Vaping Use: Never used  Substance Use Topics   Alcohol use: Yes    Comment: occassional   Drug use: No     Allergies   Latex   Review of Systems Review of Systems Per HPI  Physical Exam Triage Vital Signs ED Triage  Vitals  Enc Vitals Group     BP 12/28/21 1450 119/83     Pulse Rate 12/28/21 1450 78     Resp 12/28/21 1450 18     Temp 12/28/21 1450 98.3 F (36.8 C)     Temp Source 12/28/21 1450 Oral     SpO2 12/28/21 1450 96 %     Weight --      Height --      Head Circumference --      Peak Flow --      Pain Score 12/28/21 1451 8     Pain Loc --      Pain Edu? --      Excl. in [place]? --    No data found.  Updated Vital Signs BP 119/83 (BP Location: Right Arm)   Pulse 78   Temp 98.3 F (36.8 C) (Oral)   Resp 18   LMP 07/23/2017   SpO2 96%   Visual Acuity Right Eye Distance:   Left Eye Distance:   Bilateral Distance:    Right Eye Near:   Left Eye Near:    Bilateral Near:     Physical Exam Vitals and nursing note reviewed.  Constitutional:      General: She is not in acute distress.    Appearance: Normal appearance.  HENT:     Head: Normocephalic.  Eyes:     Extraocular Movements: Extraocular movements intact.     Conjunctiva/sclera: Conjunctivae normal.     Pupils: Pupils are equal, round, and reactive to light.  Cardiovascular:     Rate and Rhythm: Normal rate and regular rhythm.     Pulses: Normal pulses.     Heart sounds: Normal heart sounds.  Pulmonary:     Effort: Pulmonary effort is normal.     Breath sounds: Normal breath sounds.  Abdominal:     General: Bowel sounds are normal.     Palpations: Abdomen is soft.     Tenderness: There is abdominal tenderness in the right upper quadrant. There is no right CVA tenderness, left CVA tenderness, guarding or rebound.  Skin:    General: Skin is warm and dry.  Neurological:     General: No focal deficit present.     Mental Status: She is alert and oriented to person, place, and time.  Psychiatric:        Mood and Affect: Mood normal.        Behavior: Behavior normal.  UC Treatments / Results  Labs (all labs ordered are listed, but only abnormal results are displayed) Labs Reviewed  POCT URINALYSIS DIP  (MANUAL ENTRY) - Abnormal; Notable for the following components:      Result Value   Spec Grav, UA >=1.030 (*)    All other components within normal limits    EKG   Radiology DG Abd 1 View  Result Date: 12/28/2021 CLINICAL DATA:  Abdominal pain. EXAM: ABDOMEN - 1 VIEW COMPARISON:  None Available. FINDINGS: External artifacts overlie the abdomen and pelvis. There surgical clips in the pelvis. The bowel gas pattern is normal. No radio-opaque calculi or other significant radiographic abnormality are seen. IMPRESSION: Negative. Electronically Signed   By: Darliss Cheney M.D.   On: 12/28/2021 15:53    Procedures Procedures (including critical care time)  Medications Ordered in UC Medications - No data to display  Initial Impression / Assessment and Plan / UC Course  I have reviewed the triage vital signs and the nursing notes.  Pertinent labs & imaging results that were available during my care of the patient were reviewed by me and considered in my medical decision making (see chart for details).  Patient presents for complaints of right upper quadrant pain that has been present for the past 2 to 3 days.  On exam, patient has right upper quadrant tenderness.  There is no guarding, Murphy sign, or CVA tenderness.  Patient's vital signs are stable, she is in no acute distress.  At this time, there is no concern for acute abdomen.  X-ray of the patient's abdomen showed no abnormality, but do wonder if patient is having gas and bloating due to possible constipation.  Differential diagnoses include appendicitis, cholecystitis, or constipation.  Will prescribe Gas-X and Senokot to see if this helps her symptoms.  Patient was given strict indications of when to go to the emergency department.  Patient advised to continue drinking plenty of fluids and eating plenty of vegetables.  Patient advised to follow-up with her primary care physician if her symptoms do not improve. Final Clinical Impressions(s) /  UC Diagnoses   Final diagnoses:  Abdominal pain, unspecified abdominal location     Discharge Instructions      Your x-rays and urinalysis are negative at this time. Take medication as prescribed.  I am prescribing medication to help with constipation and for gas and bloating. Increase fluids and allow for plenty of rest.  Continue drinking at least 8-10 8 ounce glasses of water daily. Continue eating at least 2-3 servings of vegetables daily. May take over-the-counter Tylenol as needed for pain or discomfort. Go to the emergency department immediately if you develop worsening abdominal pain, fever, chills, nausea, vomiting, or other concerns. Follow-up with your primary care physician if your symptoms fail to improve.      ED Prescriptions     Medication Sig Dispense Auth. Provider   simethicone (GAS-X) 80 MG chewable tablet Chew 1 tablet (80 mg total) by mouth every 6 (six) hours as needed for flatulence. 30 tablet Efrat Zuidema-Warren, Sadie Haber, NP   senna (SENOKOT) 8.6 MG TABS tablet Take 1 tablet (8.6 mg total) by mouth at bedtime. 30 tablet Dmoni Fortson-Warren, Sadie Haber, NP      PDMP not reviewed this encounter.   Abran Cantor, NP 12/28/21 1606

## 2022-01-13 ENCOUNTER — Ambulatory Visit
Admission: EM | Admit: 2022-01-13 | Discharge: 2022-01-13 | Disposition: A | Discharge status: Home / Self Care | Payer: Medicaid Other | Attending: Family Medicine | Admitting: Family Medicine

## 2022-01-13 ENCOUNTER — Emergency Department (HOSPITAL_COMMUNITY)
Admission: EM | Admit: 2022-01-13 | Discharge: 2022-01-13 | Disposition: A | Discharge status: Other Acute Inpatient Hospital | Payer: Medicaid Other | Source: Home / Self Care

## 2022-01-13 ENCOUNTER — Other Ambulatory Visit: Payer: Self-pay

## 2022-01-13 ENCOUNTER — Encounter: Payer: Self-pay | Admitting: Emergency Medicine

## 2022-01-13 DIAGNOSIS — K5900 Constipation, unspecified: Secondary | ICD-10-CM

## 2022-01-13 MED ORDER — LACTULOSE 20 GM/30ML PO SOLN: 30.0000 mL | Freq: Two times a day (BID) | ORAL | 0 refills | Status: DC | PRN

## 2022-01-13 NOTE — ED Provider Notes (Signed)
[facility]   [id number] 01/13/22 Arrival Time: 1922  ASSESSMENT & PLAN:  1. Constipation, unspecified constipation type    Benign abdominal exam. No suggestion of SBO. No indications for urgent abdominal/pelvic imaging at this time. Discussed.  Begin: Meds ordered this encounter  Medications   Lactulose 20 GM/30ML SOLN    Sig: Take 30 mLs (20 g total) by mouth 2 (two) times daily as needed (for constipation).    Dispense:  450 mL    Refill:  0     Follow-up Information     [facility] EMERGENCY DEPARTMENT.   Specialty: Emergency Medicine Why: If worsening or failing to improve as anticipated. Contact information: [address] [id number] mc [place] [address] [phone]                Reviewed expectations re: course of current medical issues. Questions answered. Outlined signs and symptoms indicating need for more acute intervention. Patient verbalized understanding. After Visit Summary given.   SUBJECTIVE: History from: patient. Lisa Hanna is a 36 y.o. female who presents with complaint of persistent constipation; see here recently; note reviewed. Still with abd bloating; very small BMs. Enema helps. Afebrile. No n/v.   Patient's last menstrual period was 07/23/2017. Past Surgical History:  Procedure Laterality Date   HAND SURGERY     LEEP     VAGINAL HYSTERECTOMY N/A 08/02/2017   Procedure: HYSTERECTOMY VAGINAL;  Surgeon: Lazaro Arms, MD;  Location: [facility];  Service: Gynecology;  Laterality: N/A;   WISDOM TOOTH EXTRACTION Bilateral      OBJECTIVE:  Vitals:   01/13/22 1931  BP: 116/80  Pulse: 83  Resp: 16  Temp: 98.7 F (37.1 C)  TempSrc: Oral  SpO2: 98%    General appearance: alert, oriented, no acute distress HEENT: [place]; AT; oropharynx moist Lungs: unlabored respirations Abdomen: soft; without distention; vague discomfort with palpation; bowel sounds present;without masses  or organomegaly; without guarding or rebound tenderness Back: without reported CVA tenderness; FROM at waist Extremities: without LE edema; symmetrical; without gross deformities Skin: warm and dry Neurologic: normal gait Psychological: alert and cooperative; normal mood and affect   Allergies  Allergen Reactions   Latex Dermatitis                                               Past Medical History:  Diagnosis Date   Miscarriage    Panic attack    Vaginal Pap smear, abnormal     Social History   Socioeconomic History   Marital status: Married    Spouse name: Not on file   Number of children: 2   Years of education: Not on file   Highest education level: Not on file  Occupational History   Not on file  Tobacco Use   Smoking status: Some Days    Packs/day: 0.25    Years: 1.00    Total pack years: 0.25    Types: Cigarettes   Smokeless tobacco: Never  Vaping Use   Vaping Use: Never used  Substance and Sexual Activity   Alcohol use: Yes    Comment: occassional   Drug use: No   Sexual activity: Yes    Birth control/protection: Surgical    Comment: hyst  Other Topics Concern   Not on file  Social History Narrative   Not on file  Social Determinants of Health   Financial Resource Strain: Not on file  Food Insecurity: Not on file  Transportation Needs: Not on file  Physical Activity: Not on file  Stress: Not on file  Social Connections: Not on file  Intimate Partner Violence: Not on file    Family History  Problem Relation Age of Onset   Asthma Maternal Grandmother    Hypertension Maternal Grandmother    Asthma Mother    Hypertension Mother    Asthma Brother    Asthma Daughter      Mardella Layman, MD 01/13/22 [phone]

## 2022-01-13 NOTE — ED Triage Notes (Signed)
Pt reports seen for same x2 weeks ago. Pt reports had abd xray completed and px laxative. Pt reports has used this and denies any changes in symptoms. Pt reports used enema x2 days ago and reports is unable to have BM without enema.   Pt denies fever, emesis. Reports is having flatus but is constantly uncomfortable.

## 2022-01-14 ENCOUNTER — Encounter (HOSPITAL_COMMUNITY): Payer: Self-pay

## 2022-01-14 ENCOUNTER — Other Ambulatory Visit: Payer: Self-pay

## 2022-01-14 ENCOUNTER — Emergency Department (HOSPITAL_COMMUNITY)
Admission: EM | Admit: 2022-01-14 | Discharge: 2022-01-14 | Disposition: A | Discharge status: Home / Self Care | Payer: Medicaid Other | Attending: Emergency Medicine | Admitting: Emergency Medicine

## 2022-01-14 DIAGNOSIS — K5900 Constipation, unspecified: Secondary | ICD-10-CM | POA: Diagnosis present

## 2022-01-14 DIAGNOSIS — R109 Unspecified abdominal pain: Secondary | ICD-10-CM | POA: Diagnosis not present

## 2022-01-14 DIAGNOSIS — F17210 Nicotine dependence, cigarettes, uncomplicated: Secondary | ICD-10-CM | POA: Diagnosis not present

## 2022-01-14 MED ORDER — POLYETHYLENE GLYCOL 3350 17 G PO PACK: 17.0000 g | PACK | Freq: Every day | ORAL | 1 refills | Status: DC

## 2022-01-14 MED ORDER — POLYETHYLENE GLYCOL 3350 17 G PO PACK: 150.0000 g | PACK | Freq: Once | ORAL | Status: DC

## 2022-01-14 MED ORDER — POLYETHYLENE GLYCOL 3350 17 G PO PACK
100.0000 g | PACK | Freq: Once | ORAL | Status: AC
  Administered 2022-01-14: 100 g via ORAL
  Filled 2022-01-14: qty 6

## 2022-01-14 NOTE — Discharge Instructions (Addendum)
You were evaluated in the Emergency Department and after careful evaluation, we did not find any emergent condition requiring admission or further testing in the hospital.  Your exam/testing today is overall reassuring.  Symptoms likely due to constipation.  Take the MiraLAX medication as we discussed.  Please return to the Emergency Department if you experience any worsening of your condition.   Thank you for allowing [place] to be a part of your care.

## 2022-01-14 NOTE — ED Triage Notes (Signed)
Presents with constpation x2 weeks. States that only able to pass little stool. Says pain is in under abdomen. Pt went to UC today and was prescribed lactulose but she was unable to get it filled.

## 2022-01-14 NOTE — ED Provider Notes (Signed)
[facility] Emergency Department Provider Note MRN:  [id number]  Arrival date & time: 01/14/22     Chief Complaint   Constipation   History of Present Illness   Lisa Hanna is a 36 y.o. year-old female with no pertinent past medical history presenting to the ED with chief complaint of constipation.  Constipation, no bowel movements for the past 2 days, feeling bloated, having some abdominal discomfort generally.  No fever, no vomiting.  Review of Systems  A thorough review of systems was obtained and all systems are negative except as noted in the HPI and PMH.   Patient's Health History    Past Medical History:  Diagnosis Date   Miscarriage    Panic attack    Vaginal Pap smear, abnormal     Past Surgical History:  Procedure Laterality Date   HAND SURGERY     LEEP     VAGINAL HYSTERECTOMY N/A 08/02/2017   Procedure: HYSTERECTOMY VAGINAL;  Surgeon: Lazaro Arms, MD;  Location: [facility];  Service: Gynecology;  Laterality: N/A;   WISDOM TOOTH EXTRACTION Bilateral     Family History  Problem Relation Age of Onset   Asthma Maternal Grandmother    Hypertension Maternal Grandmother    Asthma Mother    Hypertension Mother    Asthma Brother    Asthma Daughter     Social History   Socioeconomic History   Marital status: Married    Spouse name: Not on file   Number of children: 2   Years of education: Not on file   Highest education level: Not on file  Occupational History   Not on file  Tobacco Use   Smoking status: Some Days    Packs/day: 0.25    Years: 1.00    Total pack years: 0.25    Types: Cigarettes   Smokeless tobacco: Never  Vaping Use   Vaping Use: Never used  Substance and Sexual Activity   Alcohol use: Yes    Comment: occassional   Drug use: No   Sexual activity: Yes    Birth control/protection: Surgical    Comment: hyst  Other Topics Concern   Not on file  Social History Narrative   Not on file   Social  Determinants of Health   Financial Resource Strain: Not on file  Food Insecurity: Not on file  Transportation Needs: Not on file  Physical Activity: Not on file  Stress: Not on file  Social Connections: Not on file  Intimate Partner Violence: Not on file     Physical Exam   Vitals:   01/14/22 0138  BP: 123/89  Pulse: 72  Resp: 18  Temp: 98.3 F (36.8 C)  SpO2: 98%    CONSTITUTIONAL: Well-appearing, NAD NEURO/PSYCH:  Alert and oriented x 3, no focal deficits EYES:  eyes equal and reactive ENT/NECK:  no LAD, no JVD CARDIO: Regular rate, well-perfused, normal S1 and S2 PULM:  CTAB no wheezing or rhonchi GI/GU:  non-distended, non-tender MSK/SPINE:  No gross deformities, no edema SKIN:  no rash, atraumatic   *Additional and/or pertinent findings included in MDM below  Diagnostic and Interventional Summary    EKG Interpretation  Date/Time:    Ventricular Rate:    PR Interval:    QRS Duration:   QT Interval:    QTC Calculation:   R Axis:     Text Interpretation:         Labs Reviewed - No data to display  No orders to  display    Medications  polyethylene glycol (MIRALAX / GLYCOLAX) packet 100 g (has no administration in time range)     Procedures  /  Critical Care Fecal disimpaction  Date/Time: 01/14/2022 1:59 AM  Performed by: Sabas Sous, MD Authorized by: Sabas Sous, MD  Consent: Verbal consent obtained. Risks and benefits: risks, benefits and alternatives were discussed Consent given by: patient Patient understanding: patient states understanding of the procedure being performed Patient identity confirmed: verbally with patient Time out: Immediately prior to procedure a "time out" was called to verify the correct patient, procedure, equipment, support staff and site/side marked as required. Local anesthesia used: no  Anesthesia: Local anesthesia used: no  Sedation: Patient sedated: no  Patient tolerance: patient tolerated the  procedure well with no immediate complications Comments: No stool palpated within the rectal vault.     ED Course and Medical Decision Making  Initial Impression and Ddx Patient is well-appearing, normal vital signs, abdomen is completely soft and nontender, no rebound guarding or rigidity.  Low concern for emergent process.  With a history I suspect that her symptoms are due to constipation.  She initially was endorsing a large stool ball that she could not pass.  And so fecal impaction was also considered, rectal exam performed revealing no stool in the rectal vault.  She is appropriate for a more aggressive stool regimen at home.  She explains that she wants to start feeling better soon as possible, wants a dose of MiraLAX prior to departure.  [place] the road.  Giving large dose MiraLAX to hopefully clear her constipation.  Past medical/surgical history that increases complexity of ED encounter: None  Interpretation of Diagnostics Laboratory and/or imaging options to aid in the diagnosis/care of the patient were considered.  After careful history and physical examination, it was determined that there was no indication for diagnostics at this time.  Patient Reassessment and Ultimate Disposition/Management     Discharge  Patient management required discussion with the following services or consulting groups:  None  Complexity of Problems Addressed Acute illness or injury that poses threat of life of bodily function  Additional Data Reviewed and Analyzed Further history obtained from: None  Additional Factors Impacting ED Encounter Risk Prescriptions and Minor Procedures  Elmer Sow. Pilar Plate, MD [facility] Emergency Medicine [facility] [email or web address]  Final Clinical Impressions(s) / ED Diagnoses     ICD-10-CM   1. Constipation, unspecified constipation type  K59.00       ED Discharge Orders          Ordered    polyethylene glycol (MIRALAX)  17 g packet  Daily        01/14/22 0157             Discharge Instructions Discussed with and Provided to Patient:    Discharge Instructions      You were evaluated in the Emergency Department and after careful evaluation, we did not find any emergent condition requiring admission or further testing in the hospital.  Your exam/testing today is overall reassuring.  Symptoms likely due to constipation.  Take the MiraLAX medication as we discussed.  Please return to the Emergency Department if you experience any worsening of your condition.   Thank you for allowing [place] to be a part of your care.      Sabas Sous, MD 01/14/22 0200

## 2022-02-25 ENCOUNTER — Ambulatory Visit
Admission: EM | Admit: 2022-02-25 | Discharge: 2022-02-25 | Disposition: A | Discharge status: Home / Self Care | Payer: Medicaid Other | Attending: Emergency Medicine | Admitting: Emergency Medicine

## 2022-02-25 DIAGNOSIS — B3731 Acute candidiasis of vulva and vagina: Secondary | ICD-10-CM | POA: Insufficient documentation

## 2022-02-25 DIAGNOSIS — N898 Other specified noninflammatory disorders of vagina: Secondary | ICD-10-CM | POA: Diagnosis not present

## 2022-02-25 MED ORDER — CLINDAMYCIN HCL 300 MG PO CAPS: 300.0000 mg | ORAL_CAPSULE | Freq: Two times a day (BID) | ORAL | 0 refills | Status: DC

## 2022-02-25 MED ORDER — FLUCONAZOLE 150 MG PO TABS: 150.0000 mg | ORAL_TABLET | Freq: Once | ORAL | 1 refills | Status: AC

## 2022-02-25 NOTE — ED Triage Notes (Signed)
Pt reports white vaginal discharge x 2-3 days.

## 2022-02-25 NOTE — Discharge Instructions (Addendum)
Start the Diflucan today.  If your labs come back positive for clue cells, then start the clindamycin.  Give [place] a working phone number so that we can contact you if needed. Refrain from sexual contact until all of your labs have come back, symptoms have resolved, and your partner(s) are treated if necessary. Return to the ER if you get worse, have a fever >100.4, or for any concerns.   Go to www.goodrx.com  or www.costplusdrugs.com to look up your medications. This will give you a list of where you can find your prescriptions at the most affordable prices. Or ask the pharmacist what the cash price is, or if they have any other discount programs available to help make your medication more affordable. This can be less expensive than what you would pay with insurance.

## 2022-02-25 NOTE — ED Provider Notes (Signed)
HPI  SUBJECTIVE:  Lisa Hanna is a 36 y.o. female who presents with 2 days of thick, white, nonodorous vaginal discharge, itching.  No genital rash, abnormal vaginal bleeding, odor, urinary complaints, abdominal, back, pelvic pain, nausea, vomiting, fevers.  She states that she has not been sexually active in a month.  She states that she was in wet clothes for prolonged period of time, and symptoms started shortly thereafter.  No recent antibiotics.  No aggravating or alleviating factors.  She has not tried anything for this.  She has a remote history of chlamydia, BV and yeast.  No history of diabetes.  LMP: Status post hysterectomy.  PCP: [facility] health department   Past Medical History:  Diagnosis Date   Miscarriage    Panic attack    Vaginal Pap smear, abnormal     Past Surgical History:  Procedure Laterality Date   HAND SURGERY     LEEP     VAGINAL HYSTERECTOMY N/A 08/02/2017   Procedure: HYSTERECTOMY VAGINAL;  Surgeon: Lazaro Arms, MD;  Location: [facility];  Service: Gynecology;  Laterality: N/A;   WISDOM TOOTH EXTRACTION Bilateral     Family History  Problem Relation Age of Onset   Asthma Maternal Grandmother    Hypertension Maternal Grandmother    Asthma Mother    Hypertension Mother    Asthma Brother    Asthma Daughter     Social History   Tobacco Use   Smoking status: Some Days    Packs/day: 0.25    Years: 1.00    Total pack years: 0.25    Types: Cigarettes   Smokeless tobacco: Never  Vaping Use   Vaping Use: Never used  Substance Use Topics   Alcohol use: Yes    Comment: occassional   Drug use: No    No current facility-administered medications for this encounter.  Current Outpatient Medications:    clindamycin (CLEOCIN) 300 MG capsule, Take 1 capsule (300 mg total) by mouth 2 (two) times daily., Disp: 14 capsule, Rfl: 0   fluconazole (DIFLUCAN) 150 MG tablet, Take 1 tablet (150 mg total) by mouth once for 1 dose. 1 tab po x 1. May repeat  in 72 hours if no improvement, Disp: 2 tablet, Rfl: 1   fluticasone (FLONASE) 50 MCG/ACT nasal spray, Place 2 sprays into both nostrils daily., Disp: 16 g, Rfl: 2   ibuprofen (ADVIL) 600 MG tablet, Take 1 tablet (600 mg total) by mouth every 6 (six) hours as needed., Disp: 30 tablet, Rfl: 0   Lactulose 20 GM/30ML SOLN, Take 30 mLs (20 g total) by mouth 2 (two) times daily as needed (for constipation)., Disp: 450 mL, Rfl: 0   methocarbamol (ROBAXIN) 500 MG tablet, Take 1 tablet (500 mg total) by mouth at bedtime as needed for muscle spasms., Disp: 20 tablet, Rfl: 0   mupirocin ointment (BACTROBAN) 2 %, Apply 1 application. topically 2 (two) times daily., Disp: 22 g, Rfl: 0   nystatin cream (MYCOSTATIN), Apply to affected area 2 times daily x 7 days, Disp: 30 g, Rfl: 0   polyethylene glycol (MIRALAX) 17 g packet, Take 17 g by mouth daily., Disp: 30 each, Rfl: 1   simethicone (GAS-X) 80 MG chewable tablet, Chew 1 tablet (80 mg total) by mouth every 6 (six) hours as needed for flatulence., Disp: 30 tablet, Rfl: 0  Allergies  Allergen Reactions   Latex Dermatitis     ROS  As noted in HPI.   Physical Exam  BP  129/88 (BP Location: Right Arm)   Pulse 97   Temp 98.8 F (37.1 C) (Oral)   Resp 16   LMP 07/23/2017   SpO2 96%   Constitutional: Well developed, well nourished, no acute distress Eyes:  EOMI, conjunctiva normal bilaterally HENT: Normocephalic, atraumatic,mucus membranes moist Respiratory: Normal inspiratory effort Cardiovascular: Normal rate GI: nondistended soft, nontender. No suprapubic tenderness  back: No CVA tenderness GU: Deferred skin: No rash, skin intact Musculoskeletal: no deformities Neurologic: Alert & oriented x 3, no focal neuro deficits Psychiatric: Speech and behavior appropriate   ED Course   Medications - No data to display  No orders of the defined types were placed in this encounter.   No results found for this or any previous visit (from the  past 24 hour(s)). No results found.  ED Clinical Impression  1. Yeast vaginitis   2. Vaginal discharge      ED Assessment/Plan     H&P most c/w yeast infection.  Sent off swab for STDs, BV and yeast.  Will send home with Diflucan and a wait-and-see prescription of clindamycin 300 mg p.o. twice daily for 1 week.  Patient states that she does not tolerate oral Flagyl and does not like intravaginal gels/creams.  Advised pt to refrain from sexual contact until she  knows lab results, symptoms resolve, and partner(s) are treated if necessary. Pt provided working phone number. Follow-up with PCP as needed. Discussed labs, MDM, plan and followup with patient. Pt agrees with plan.   Meds ordered this encounter  Medications   fluconazole (DIFLUCAN) 150 MG tablet    Sig: Take 1 tablet (150 mg total) by mouth once for 1 dose. 1 tab po x 1. May repeat in 72 hours if no improvement    Dispense:  2 tablet    Refill:  1   clindamycin (CLEOCIN) 300 MG capsule    Sig: Take 1 capsule (300 mg total) by mouth 2 (two) times daily.    Dispense:  14 capsule    Refill:  0    *This clinic note was created using [employer]. Therefore, there may be occasional mistakes despite careful proofreading.  ?     Melynda Ripple, MD 02/25/22 [id number]

## 2022-02-28 LAB — CERVICOVAGINAL ANCILLARY ONLY
Bacterial Vaginitis (gardnerella): NEGATIVE
Candida Glabrata: NEGATIVE
Candida Vaginitis: NEGATIVE
Chlamydia: NEGATIVE
Comment: NEGATIVE
Comment: NEGATIVE
Comment: NEGATIVE
Comment: NEGATIVE
Comment: NEGATIVE
Comment: NORMAL
Neisseria Gonorrhea: NEGATIVE
Trichomonas: NEGATIVE

## 2022-04-05 ENCOUNTER — Encounter: Payer: Self-pay | Admitting: Emergency Medicine

## 2022-04-05 ENCOUNTER — Ambulatory Visit
Admission: EM | Admit: 2022-04-05 | Discharge: 2022-04-05 | Disposition: A | Discharge status: Home / Self Care | Payer: Commercial Managed Care - PPO | Attending: Nurse Practitioner | Admitting: Nurse Practitioner

## 2022-04-05 DIAGNOSIS — N898 Other specified noninflammatory disorders of vagina: Secondary | ICD-10-CM | POA: Diagnosis present

## 2022-04-05 MED ORDER — FLUCONAZOLE 150 MG PO TABS: 150.0000 mg | ORAL_TABLET | Freq: Every day | ORAL | 0 refills | Status: DC

## 2022-04-05 NOTE — ED Triage Notes (Signed)
Vaginal irritation since Sunday.  States she is unsure if her detergent is causing the irritation.  States area burns after wiping.

## 2022-04-05 NOTE — ED Provider Notes (Signed)
[facility]    CSN: [id number] Arrival date & time: 04/05/22  1814      History   Chief Complaint No chief complaint on file.   HPI Lisa Hanna is a 36 y.o. female.   The history is provided by the patient.   Patient presents with a 2-day history of vaginal irritation.  She states that her mother recently changed her laundry detergent.  She states since that time, she has had irritation with wiping, and states that she feels "wet" in her vaginal area.  Patient reports that she is very sensitive to new soaps, or detergents.  She denies vaginal discharge, vaginal odor, urinary frequency, urgency, hesitancy, or frequency.  She also denies any abdominal pain.  She does have a history of BV, denies previous history of STI.  Patient reports 1 female partner in the past 90 days.  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   LSIL (low grade squamous intraepithelial lesion) on Pap smear 11/27/2010   Status post LEEP (loop electrosurgical excision procedure) of cervix 11/27/2010    Past Surgical History:  Procedure Laterality Date   HAND SURGERY     LEEP     VAGINAL HYSTERECTOMY N/A 08/02/2017   Procedure: HYSTERECTOMY VAGINAL;  Surgeon: Lazaro Arms, MD;  Location: [facility];  Service: Gynecology;  Laterality: N/A;   WISDOM TOOTH EXTRACTION Bilateral     OB History     Gravida  5   Para  2   Term  2   Preterm      AB  3   Living  2      SAB  1   IAB      Ectopic      Multiple      Live Births               Home Medications    Prior to Admission medications   Medication Sig Start Date End Date Taking? Authorizing Provider   clindamycin (CLEOCIN) 300 MG capsule Take 1 capsule (300 mg total) by mouth 2 (two) times daily. 02/25/22   Domenick Gong, MD  fluticasone (FLONASE) 50 MCG/ACT nasal spray Place 2 sprays into both nostrils daily. 04/25/19   Raeford Razor, MD  ibuprofen (ADVIL) 600 MG tablet Take 1 tablet (600 mg total) by mouth every 6 (six) hours as needed. 01/30/21   Lamptey, Britta Mccreedy, MD  Lactulose 20 GM/30ML SOLN Take 30 mLs (20 g total) by mouth 2 (two) times daily as needed (for constipation). 01/13/22   Mardella Layman, MD  methocarbamol (ROBAXIN) 500 MG tablet Take 1 tablet (500 mg total) by mouth at bedtime as needed for muscle spasms. 01/30/21   LampteyBritta Mccreedy, MD  mupirocin ointment (BACTROBAN) 2 % Apply 1 application. topically 2 (two) times daily. 10/28/21   Verda Mehta-Warren, Sadie Haber, NP  nystatin cream (MYCOSTATIN) Apply to affected area 2 times daily x 7 days 05/30/20   Rodriguez-Southworth, Nettie Elm, PA-C  polyethylene glycol (MIRALAX) 17 g packet Take 17 g by mouth daily. 01/14/22   Sabas Sous, MD  simethicone (GAS-X) 80 MG chewable tablet Chew 1 tablet (80 mg total) by  mouth every 6 (six) hours as needed for flatulence. 12/28/21   Hurshell Dino-Warren, Alda Lea, NP    Family History Family History  Problem Relation Age of Onset   Asthma Maternal Grandmother    Hypertension Maternal Grandmother    Asthma Mother    Hypertension Mother    Asthma Brother    Asthma Daughter     Social History Social History   Tobacco Use   Smoking status: Some Days    Packs/day: 0.25    Years: 1.00    Total pack years: 0.25    Types: Cigarettes   Smokeless tobacco: Never  Vaping Use   Vaping Use: Never used  Substance Use Topics   Alcohol use: Yes    Comment: occassional   Drug use: No     Allergies   Latex   Review of Systems Review of Systems Per HPI  Physical Exam Triage Vital Signs ED Triage Vitals  Enc Vitals Group     BP 04/05/22 1828 124/86     Pulse Rate 04/05/22 1828 85     Resp  04/05/22 1828 18     Temp 04/05/22 1828 98.6 F (37 C)     Temp Source 04/05/22 1828 Oral     SpO2 04/05/22 1828 96 %     Weight --      Height --      Head Circumference --      Peak Flow --      Pain Score 04/05/22 1829 0     Pain Loc --      Pain Edu? --      Excl. in [place]? --    No data found.  Updated Vital Signs BP 124/86 (BP Location: Right Arm)   Pulse 85   Temp 98.6 F (37 C) (Oral)   Resp 18   LMP 07/23/2017   SpO2 96%   Visual Acuity Right Eye Distance:   Left Eye Distance:   Bilateral Distance:    Right Eye Near:   Left Eye Near:    Bilateral Near:     Physical Exam Vitals and nursing note reviewed.  Constitutional:      General: She is not in acute distress.    Appearance: Normal appearance.  HENT:     Head: Normocephalic.  Eyes:     Extraocular Movements: Extraocular movements intact.     Pupils: Pupils are equal, round, and reactive to light.  Cardiovascular:     Rate and Rhythm: Normal rate and regular rhythm.  Pulmonary:     Effort: Pulmonary effort is normal. No respiratory distress.     Breath sounds: Normal breath sounds. No wheezing or rales.  Abdominal:     General: Bowel sounds are normal.     Palpations: Abdomen is soft.     Tenderness: There is no abdominal tenderness.  Genitourinary:    Comments: GU exam deferred, self swab performed  Skin:    General: Skin is warm and dry.  Neurological:     General: No focal deficit present.     Mental Status: She is alert and oriented to person, place, and time.  Psychiatric:        Mood and Affect: Mood normal.        Behavior: Behavior normal.      UC Treatments / Results  Labs (all labs ordered are listed, but only abnormal results are displayed) Labs Reviewed - No data to display  EKG   Radiology No results found.  Procedures  Procedures (including critical care time)  Medications Ordered in UC Medications - No data to display  Initial Impression / Assessment and Plan /  UC Course  I have reviewed the triage vital signs and the nursing notes.  Pertinent labs & imaging results that were available during my care of the patient were reviewed by me and considered in my medical decision making (see chart for details).  Patient presents with a 2-day history of vaginal irritation.  Patient is well-appearing, she is in no acute distress, vital signs are stable.  Based on her history of recent change in her laundry detergent, symptoms are consistent with vaginitis.  We will start patient on fluconazole 150 mg.  Supportive care recommendations were provided to the patient.  Patient was advised that cytology swab is pending, advised patient to refrain from sexual activity while she is symptomatic and until cytology swab is resulted.  Patient verbalizes understanding.  All questions were answered.  Patient is stable for discharge. Final Clinical Impressions(s) / UC Diagnoses   Final diagnoses:  None   Discharge Instructions   None    ED Prescriptions   None    PDMP not reviewed this encounter.   Abran Cantor, NP 04/05/22 1900

## 2022-04-05 NOTE — Discharge Instructions (Addendum)
Take medication as prescribed. Recommend using Dove unscented soap or warm water to clean the vaginal area. May perform warm Epsom salt soaks to help with vaginal itching or vaginal irritation. Cytology swab is pending.  If the results are positive, you will be contacted and provided treatment. Recommend refraining from sexual activity while symptoms persist and until your cytology results are received. Follow-up as needed.

## 2022-04-06 LAB — CERVICOVAGINAL ANCILLARY ONLY
Bacterial Vaginitis (gardnerella): NEGATIVE
Candida Glabrata: NEGATIVE
Candida Vaginitis: NEGATIVE
Chlamydia: NEGATIVE
Comment: NEGATIVE
Comment: NEGATIVE
Comment: NEGATIVE
Comment: NEGATIVE
Comment: NEGATIVE
Comment: NORMAL
Neisseria Gonorrhea: NEGATIVE
Trichomonas: NEGATIVE

## 2022-05-26 ENCOUNTER — Ambulatory Visit
Admission: EM | Admit: 2022-05-26 | Discharge: 2022-05-26 | Disposition: A | Discharge status: Home / Self Care | Payer: Commercial Managed Care - PPO | Attending: Nurse Practitioner | Admitting: Nurse Practitioner

## 2022-05-26 DIAGNOSIS — J309 Allergic rhinitis, unspecified: Secondary | ICD-10-CM

## 2022-05-26 MED ORDER — OLOPATADINE HCL 0.1 % OP SOLN: 1.0000 [drp] | Freq: Two times a day (BID) | OPHTHALMIC | 0 refills | Status: DC

## 2022-05-26 MED ORDER — CETIRIZINE HCL 10 MG PO TABS: 10.0000 mg | ORAL_TABLET | Freq: Every day | ORAL | 0 refills | Status: DC

## 2022-05-26 MED ORDER — PSEUDOEPH-BROMPHEN-DM 30-2-10 MG/5ML PO SYRP: 5.0000 mL | ORAL_SOLUTION | Freq: Four times a day (QID) | ORAL | 0 refills | Status: DC | PRN

## 2022-05-26 NOTE — Discharge Instructions (Addendum)
Take medication as directed. Increase fluids and get plenty of rest. May take over-the-counter ibuprofen or Tylenol as needed for pain, fever, or general discomfort. Recommend normal saline nasal spray to help with nasal congestion throughout the day. For your cough, it may be helpful to use a humidifier at bedtime during sleep. If symptoms do not improve within the next 10 to 14 days, or if you develop new symptoms such as fever, chills, worsening cough, or other concerns, please follow-up in this clinic or with your primary care physician for further evaluation. Follow-up as needed.

## 2022-05-26 NOTE — ED Triage Notes (Signed)
Pt states that she has some itching and swelling of eyes. Pt states that she has some right ear pain and coughing. X2 days

## 2022-05-26 NOTE — ED Provider Notes (Signed)
[facility]    CSN: [id number] Arrival date & time: 05/26/22  0820      History   Chief Complaint Chief Complaint  Patient presents with   Eye Problem    Swelling  and itching of eyes. Right ear pain. X2 days    HPI Amarrah L Ben is a 36 y.o. female.   The history is provided by the patient.   The patient presents for complaints of bilateral eye itching and swelling along with right ear pain and cough that started over the past 2 days.  Patient denies fever, chills, sore throat, headache, wheezing, shortness of breath, difficulty breathing, or GI symptoms.  Patient reports she does have a history of seasonal allergies, but does not take any medications regularly.  Patient reports that she has been taking Mucinex for her symptoms.  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   LSIL (low grade squamous intraepithelial lesion) on Pap smear 11/27/2010   Status post LEEP (loop electrosurgical excision procedure) of cervix 11/27/2010    Past Surgical History:  Procedure Laterality Date   HAND SURGERY     LEEP     VAGINAL HYSTERECTOMY N/A 08/02/2017   Procedure: HYSTERECTOMY VAGINAL;  Surgeon: Lazaro Arms, MD;  Location: [facility];  Service: Gynecology;  Laterality: N/A;   WISDOM TOOTH EXTRACTION Bilateral     OB History     Gravida  5   Para  2   Term  2   Preterm      AB  3   Living  2      SAB  1   IAB      Ectopic      Multiple      Live Births               Home Medications    Prior to Admission medications   Medication Sig Start Date End Date Taking? Authorizing Provider  brompheniramine-pseudoephedrine-DM 30-2-10  MG/5ML syrup Take 5 mLs by mouth 4 (four) times daily as needed. 05/26/22  Yes Anelle Parlow-Warren, Sadie Haber, NP  cetirizine (ZYRTEC) 10 MG tablet Take 1 tablet (10 mg total) by mouth daily. 05/26/22  Yes Kritika Stukes-Warren, Sadie Haber, NP  clindamycin (CLEOCIN) 300 MG capsule Take 1 capsule (300 mg total) by mouth 2 (two) times daily. 02/25/22  Yes Domenick Gong, MD  fluconazole (DIFLUCAN) 150 MG tablet Take 1 tablet (150 mg total) by mouth daily. Take 1 tablet today, then take 1 tablet every 72 hours if symptoms persist. 04/05/22  Yes Travonne Schowalter-Warren, Sadie Haber, NP  fluticasone (FLONASE) 50 MCG/ACT nasal spray Place 2 sprays into both nostrils daily. 04/25/19  Yes Raeford Razor, MD  ibuprofen (ADVIL) 600 MG tablet Take 1 tablet (600 mg total) by mouth every 6 (six) hours as needed. 01/30/21  Yes Lamptey, Britta Mccreedy, MD  Lactulose 20 GM/30ML SOLN Take 30 mLs (20 g total) by mouth 2 (two) times daily as needed (for constipation). 01/13/22  Yes Hagler, Arlys John, MD  methocarbamol (ROBAXIN) 500 MG tablet Take 1 tablet (500 mg total) by mouth at bedtime as needed for muscle spasms. 01/30/21  Yes Lamptey, Britta Mccreedy, MD  mupirocin ointment (BACTROBAN) 2 % Apply 1 application. topically 2 (two) times daily. 10/28/21  Yes Johntavius Shepard-Warren, Sadie Haber, NP  nystatin cream (MYCOSTATIN) Apply to affected area 2 times daily x 7 days 05/30/20  Yes Rodriguez-Southworth, Nettie Elm, PA-C  olopatadine (PATANOL) 0.1 % ophthalmic solution Place 1 drop into both eyes 2 (two) times daily. 05/26/22  Yes Kashlyn Salinas-Warren, Sadie Haber, NP  polyethylene glycol (MIRALAX) 17 g packet Take 17 g by mouth daily. 01/14/22  Yes Sabas Sous, MD  simethicone (GAS-X) 80 MG chewable tablet Chew 1 tablet (80 mg total) by mouth every 6 (six) hours as needed for flatulence. 12/28/21  Yes Alishea Beaudin-Warren, Sadie Haber, NP    Family History Family History  Problem Relation Age of Onset   Asthma Maternal Grandmother    Hypertension Maternal Grandmother    Asthma Mother     Hypertension Mother    Asthma Brother    Asthma Daughter     Social History Social History   Tobacco Use   Smoking status: Some Days    Packs/day: 0.25    Years: 1.00    Total pack years: 0.25    Types: Cigarettes   Smokeless tobacco: Never  Vaping Use   Vaping Use: Never used  Substance Use Topics   Alcohol use: Yes    Comment: occassional   Drug use: No     Allergies   Latex   Review of Systems Review of Systems Per HPI  Physical Exam Triage Vital Signs ED Triage Vitals  Enc Vitals Group     BP 05/26/22 0937 114/83     Pulse Rate 05/26/22 0937 94     Resp 05/26/22 0937 18     Temp 05/26/22 0937 98.1 F (36.7 C)     Temp Source 05/26/22 0937 Oral     SpO2 05/26/22 0937 98 %     Weight --      Height 05/26/22 0936 5\' 6"  (1.676 m)     Head Circumference --      Peak Flow --      Pain Score 05/26/22 0936 5     Pain Loc --      Pain Edu? --      Excl. in GC? --    No data found.  Updated Vital Signs BP 114/83 (BP Location: Right Arm)   Pulse 94   Temp 98.1 F (36.7 C) (Oral)   Resp 18   Ht 5\' 6"  (1.676 m)   LMP 07/23/2017   SpO2 98%   BMI 30.34 kg/m   Visual Acuity Right Eye Distance:   Left Eye Distance:   Bilateral Distance:    Right Eye Near:   Left Eye Near:    Bilateral Near:     Physical Exam Vitals and nursing note reviewed.  Constitutional:      General: She is not in acute distress.    Appearance: Normal appearance.  HENT:     Head: Normocephalic.     Right Ear: Tympanic membrane, ear canal and external ear normal.     Left Ear: Tympanic membrane, ear canal and external ear normal.     Nose: Congestion present. No rhinorrhea.     Mouth/Throat:     Mouth: Mucous membranes are moist.     Pharynx: Posterior oropharyngeal erythema present.  Eyes:     General: Lids are normal. Vision grossly intact. Allergic shiner present.        Right eye: No foreign body, discharge or hordeolum.  Left eye: No foreign body, discharge  or hordeolum.     Extraocular Movements: Extraocular movements intact.     Right eye: Normal extraocular motion and no nystagmus.     Left eye: Normal extraocular motion and no nystagmus.     Conjunctiva/sclera: Conjunctivae normal.     Right eye: Right conjunctiva is not injected. No chemosis.    Left eye: Left conjunctiva is not injected. No chemosis.    Pupils: Pupils are equal, round, and reactive to light.  Cardiovascular:     Rate and Rhythm: Normal rate and regular rhythm.     Pulses: Normal pulses.     Heart sounds: Normal heart sounds.  Pulmonary:     Effort: Pulmonary effort is normal. No respiratory distress.     Breath sounds: Normal breath sounds. No stridor. No wheezing, rhonchi or rales.  Abdominal:     General: Bowel sounds are normal.     Palpations: Abdomen is soft.     Tenderness: There is no abdominal tenderness.  Musculoskeletal:     Cervical back: Normal range of motion.  Lymphadenopathy:     Cervical: No cervical adenopathy.  Skin:    General: Skin is warm and dry.  Neurological:     General: No focal deficit present.     Mental Status: She is alert and oriented to person, place, and time.  Psychiatric:        Mood and Affect: Mood normal.        Behavior: Behavior normal.      UC Treatments / Results  Labs (all labs ordered are listed, but only abnormal results are displayed) Labs Reviewed - No data to display  EKG   Radiology No results found.  Procedures Procedures (including critical care time)  Medications Ordered in UC Medications - No data to display  Initial Impression / Assessment and Plan / UC Course  I have reviewed the triage vital signs and the nursing notes.  Pertinent labs & imaging results that were available during my care of the patient were reviewed by me and considered in my medical decision making (see chart for details).  The patient is well-appearing, she is in no acute distress, vital signs are stable.  Symptoms  appear to be consistent with allergic rhinitis, based on her eye complaints, and postnasal drainage seen on her exam.  Will start patient on Patanol 0.1% eyedrops for eye itching and swelling, Zyrtec 10 mg and Bromfed-DM for cough and allergic rhinitis symptoms.  Supportive care recommendations were provided to the patient to include use of Tylenol or ibuprofen as needed for pain or discomfort.  Also advised that normal saline nasal spray will be helpful for nasal congestion.  Patient was given strict return precautions.  Patient verbalizes understanding.  All questions were answered.  Patient is stable for discharge.  Work note was provided.   Final Clinical Impressions(s) / UC Diagnoses   Final diagnoses:  Allergic rhinitis, unspecified seasonality, unspecified trigger     Discharge Instructions      Take medication as directed. Increase fluids and get plenty of rest. May take over-the-counter ibuprofen or Tylenol as needed for pain, fever, or general discomfort. Recommend normal saline nasal spray to help with nasal congestion throughout the day. For your cough, it may be helpful to use a humidifier at bedtime during sleep. If symptoms do not improve within the next 10 to 14 days, or if you develop new symptoms such as fever, chills, worsening cough, or other concerns,  please follow-up in this clinic or with your primary care physician for further evaluation. Follow-up as needed.     ED Prescriptions     Medication Sig Dispense Auth. Provider   olopatadine (PATANOL) 0.1 % ophthalmic solution Place 1 drop into both eyes 2 (two) times daily. 5 mL Mykenzie Ebanks-Warren, Sadie Haberhristie J, NP   brompheniramine-pseudoephedrine-DM 30-2-10 MG/5ML syrup Take 5 mLs by mouth 4 (four) times daily as needed. 140 mL Chantz Montefusco-Warren, Sadie Haberhristie J, NP   cetirizine (ZYRTEC) 10 MG tablet Take 1 tablet (10 mg total) by mouth daily. 30 tablet Franchot Pollitt-Warren, Sadie Haberhristie J, NP      PDMP not reviewed this encounter.    Abran CantorLeath-Warren, Beuford Garcilazo J, NP 05/26/22 1013

## 2022-06-01 ENCOUNTER — Encounter (HOSPITAL_COMMUNITY): Payer: Self-pay

## 2022-06-01 ENCOUNTER — Other Ambulatory Visit: Payer: Self-pay

## 2022-06-01 ENCOUNTER — Emergency Department (HOSPITAL_COMMUNITY): Payer: Commercial Managed Care - PPO

## 2022-06-01 DIAGNOSIS — Z1152 Encounter for screening for COVID-19: Secondary | ICD-10-CM | POA: Diagnosis not present

## 2022-06-01 DIAGNOSIS — R0602 Shortness of breath: Secondary | ICD-10-CM | POA: Diagnosis present

## 2022-06-01 DIAGNOSIS — J069 Acute upper respiratory infection, unspecified: Secondary | ICD-10-CM | POA: Diagnosis not present

## 2022-06-01 DIAGNOSIS — Z91040 Latex allergy status: Secondary | ICD-10-CM | POA: Diagnosis not present

## 2022-06-01 LAB — BASIC METABOLIC PANEL
Anion gap: 6 (ref 5–15)
BUN: 7 mg/dL (ref 6–20)
CO2: 24 mmol/L (ref 22–32)
Calcium: 8.7 mg/dL — ABNORMAL LOW (ref 8.9–10.3)
Chloride: 106 mmol/L (ref 98–111)
Creatinine, Ser: 0.65 mg/dL (ref 0.44–1.00)
GFR, Estimated: >60 mL/min (ref >60)
Glucose, Bld: 114 mg/dL — ABNORMAL HIGH (ref 70–99)
Potassium: 3.2 mmol/L — ABNORMAL LOW (ref 3.5–5.1)
Sodium: 136 mmol/L (ref 135–145)

## 2022-06-01 LAB — RESP PANEL BY RT-PCR (RSV, FLU A&B, COVID)  RVPGX2
Influenza A by PCR: NEGATIVE
Influenza B by PCR: NEGATIVE
Resp Syncytial Virus by PCR: NEGATIVE
SARS Coronavirus 2 by RT PCR: NEGATIVE

## 2022-06-01 LAB — CBC
HCT: 38.5 % (ref 36.0–46.0)
Hemoglobin: 12.5 g/dL (ref 12.0–15.0)
MCH: 34.6 pg — ABNORMAL HIGH (ref 26.0–34.0)
MCHC: 32.5 g/dL (ref 30.0–36.0)
MCV: 106.6 fL — ABNORMAL HIGH (ref 80.0–100.0)
Platelets: 383 10*3/uL (ref 150–400)
RBC: 3.61 MIL/uL — ABNORMAL LOW (ref 3.87–5.11)
RDW: 12 % (ref 11.5–15.5)
WBC: 8.5 10*3/uL (ref 4.0–10.5)
nRBC: 0 % (ref 0.0–0.2)

## 2022-06-01 LAB — TROPONIN I (HIGH SENSITIVITY): Troponin I (High Sensitivity): <2 ng/L (ref <18)

## 2022-06-01 NOTE — ED Triage Notes (Signed)
Reports c/o cough and congestion last week and went to urgent care.  Says was told it was "sinuses."  Reports was given cough syrup.  Tonight was asleep woke up feeling sob and heaviness in chest.  Reports still has cough.  Denies fever.

## 2022-06-02 ENCOUNTER — Emergency Department (HOSPITAL_COMMUNITY)
Admission: EM | Admit: 2022-06-02 | Discharge: 2022-06-02 | Disposition: A | Discharge status: Home / Self Care | Payer: Commercial Managed Care - PPO | Attending: Emergency Medicine | Admitting: Emergency Medicine

## 2022-06-02 DIAGNOSIS — R0602 Shortness of breath: Secondary | ICD-10-CM

## 2022-06-02 DIAGNOSIS — J069 Acute upper respiratory infection, unspecified: Secondary | ICD-10-CM

## 2022-06-02 LAB — TROPONIN I (HIGH SENSITIVITY): Troponin I (High Sensitivity): <2 ng/L (ref <18)

## 2022-06-02 LAB — D-DIMER, QUANTITATIVE: D-Dimer, Quant: 0.31 ug/mL-FEU (ref 0.00–0.50)

## 2022-06-02 NOTE — ED Provider Notes (Signed)
[facility] EMERGENCY DEPARTMENT Provider Note   CSN: [id number] Arrival date & time: 06/01/22  2157     History  Chief Complaint  Patient presents with   Shortness of Breath    Lisa Hanna is a 37 y.o. female.  Patient presents to the emergency department for evaluation of shortness of breath.  Patient reports that she has been sick for more than a week with cough, sinus congestion.  She was seen at urgent care and given a cough syrup, reports that she feels somewhat better but still has a persistent cough.  Patient comes in tonight because she felt like she could not catch her breath and she had a heaviness in her chest.       Home Medications Prior to Admission medications   Medication Sig Start Date End Date Taking? Authorizing Provider  brompheniramine-pseudoephedrine-DM 30-2-10 MG/5ML syrup Take 5 mLs by mouth 4 (four) times daily as needed. 05/26/22   Leath-Warren, Alda Lea, NP  cetirizine (ZYRTEC) 10 MG tablet Take 1 tablet (10 mg total) by mouth daily. 05/26/22   Leath-Warren, Alda Lea, NP  clindamycin (CLEOCIN) 300 MG capsule Take 1 capsule (300 mg total) by mouth 2 (two) times daily. 02/25/22   Melynda Ripple, MD  fluconazole (DIFLUCAN) 150 MG tablet Take 1 tablet (150 mg total) by mouth daily. Take 1 tablet today, then take 1 tablet every 72 hours if symptoms persist. 04/05/22   Leath-Warren, Alda Lea, NP  fluticasone (FLONASE) 50 MCG/ACT nasal spray Place 2 sprays into both nostrils daily. 04/25/19   Virgel Manifold, MD  ibuprofen (ADVIL) 600 MG tablet Take 1 tablet (600 mg total) by mouth every 6 (six) hours as needed. 01/30/21   Lamptey, Myrene Galas, MD  Lactulose 20 GM/30ML SOLN Take 30 mLs (20 g total) by mouth 2 (two) times daily as needed (for constipation). 01/13/22   Vanessa Kick, MD  methocarbamol (ROBAXIN) 500 MG tablet Take 1 tablet (500 mg total) by mouth at bedtime as needed for muscle spasms. 01/30/21   LampteyMyrene Galas, MD  mupirocin ointment  (BACTROBAN) 2 % Apply 1 application. topically 2 (two) times daily. 10/28/21   Leath-Warren, Alda Lea, NP  nystatin cream (MYCOSTATIN) Apply to affected area 2 times daily x 7 days 05/30/20   Rodriguez-Southworth, Sunday Spillers, PA-C  olopatadine (PATANOL) 0.1 % ophthalmic solution Place 1 drop into both eyes 2 (two) times daily. 05/26/22   Leath-Warren, Alda Lea, NP  polyethylene glycol (MIRALAX) 17 g packet Take 17 g by mouth daily. 01/14/22   Maudie Flakes, MD  simethicone (GAS-X) 80 MG chewable tablet Chew 1 tablet (80 mg total) by mouth every 6 (six) hours as needed for flatulence. 12/28/21   Leath-Warren, Alda Lea, NP      Allergies    Latex    Review of Systems   Review of Systems  Physical Exam Updated Vital Signs BP 106/71   Pulse 69   Temp 98.1 F (36.7 C) (Oral)   Resp 17   Ht 5\' 6"  (1.676 m)   Wt 79.8 kg   LMP 07/23/2017   SpO2 97%   BMI 28.41 kg/m  Physical Exam Vitals and nursing note reviewed.  Constitutional:      General: She is not in acute distress.    Appearance: She is well-developed.  HENT:     Head: Normocephalic and atraumatic.     Mouth/Throat:     Mouth: Mucous membranes are moist.  Eyes:     General: Vision grossly  intact. Gaze aligned appropriately.     Extraocular Movements: Extraocular movements intact.     Conjunctiva/sclera: Conjunctivae normal.  Cardiovascular:     Rate and Rhythm: Normal rate and regular rhythm.     Pulses: Normal pulses.     Heart sounds: Normal heart sounds, S1 normal and S2 normal. No murmur heard.    No friction rub. No gallop.  Pulmonary:     Effort: Pulmonary effort is normal. No respiratory distress.     Breath sounds: Normal breath sounds.  Abdominal:     General: Bowel sounds are normal.     Palpations: Abdomen is soft.     Tenderness: There is no abdominal tenderness. There is no guarding or rebound.     Hernia: No hernia is present.  Musculoskeletal:        General: No swelling.     Cervical back: Full  passive range of motion without pain, normal range of motion and neck supple. No spinous process tenderness or muscular tenderness. Normal range of motion.     Right lower leg: No edema.     Left lower leg: No edema.  Skin:    General: Skin is warm and dry.     Capillary Refill: Capillary refill takes less than 2 seconds.     Findings: No ecchymosis, erythema, rash or wound.  Neurological:     General: No focal deficit present.     Mental Status: She is alert and oriented to person, place, and time.     GCS: GCS eye subscore is 4. GCS verbal subscore is 5. GCS motor subscore is 6.     Cranial Nerves: Cranial nerves 2-12 are intact.     Sensory: Sensation is intact.     Motor: Motor function is intact.     Coordination: Coordination is intact.  Psychiatric:        Attention and Perception: Attention normal.        Mood and Affect: Mood normal.        Speech: Speech normal.        Behavior: Behavior normal.     ED Results / Procedures / Treatments   Labs (all labs ordered are listed, but only abnormal results are displayed) Labs Reviewed  BASIC METABOLIC PANEL - Abnormal; Notable for the following components:      Result Value   Potassium 3.2 (*)    Glucose, Bld 114 (*)    Calcium 8.7 (*)    All other components within normal limits  CBC - Abnormal; Notable for the following components:   RBC 3.61 (*)    MCV 106.6 (*)    MCH 34.6 (*)    All other components within normal limits  RESP PANEL BY RT-PCR (RSV, FLU A&B, COVID)  RVPGX2  D-DIMER, QUANTITATIVE  TROPONIN I (HIGH SENSITIVITY)  TROPONIN I (HIGH SENSITIVITY)    EKG EKG Interpretation  Date/Time:  Wednesday June 01 2022 22:21:16 EST Ventricular Rate:  90 PR Interval:  154 QRS Duration: 70 QT Interval:  336 QTC Calculation: 411 R Axis:   23 Text Interpretation: Normal sinus rhythm Normal ECG When compared with ECG of 13-Aug-2021 23:01, Criteria for Inferior infarct are no longer Present Nonspecific T wave  abnormality, improved in Anterior leads Confirmed by Orpah Greek [phone]) on 06/02/2022 1:49:42 AM  Radiology DG Chest 2 View  Result Date: 06/01/2022 CLINICAL DATA:  Shortness of breath, chest pressure.  Cough. EXAM: CHEST - 2 VIEW COMPARISON:  Chest radiograph dated May 05, 2019 FINDINGS: The heart size and mediastinal contours are within normal limits. Both lungs are clear. The visualized skeletal structures are unremarkable. IMPRESSION: No active cardiopulmonary disease. Electronically Signed   By: Keane Police D.O.   On: 06/01/2022 22:51    Procedures Procedures    Medications Ordered in ED Medications - No data to display  ED Course/ Medical Decision Making/ A&P                           Medical Decision Making Amount and/or Complexity of Data Reviewed Labs: ordered. Radiology: ordered.   Presents to the emergency department for evaluation of shortness of breath.  Patient had an episode of feeling like she could not catch her breath earlier but this is improving now.  She has been sick for more than a week with cough and congestion.  She was told that she had sinus problems at urgent care.  She still has a cough evident on exam but her lungs are clear.  No wheezing.  No clinical concern for pneumonia.  Chest x-ray is clear.  Patient has normal troponins and EKG.  She did, however, have mild tachycardia at arrival.  This has self corrected.  She is not tachypneic or tachycardic.  She does not have any risk factors for PE.  Cannot PERC her because of tachycardia.  She is very low risk by Wells criteria.  D-dimer is negative.  No further workup necessary.        Final Clinical Impression(s) / ED Diagnoses Final diagnoses:  Shortness of breath  Upper respiratory tract infection, unspecified type    Rx / DC Orders ED Discharge Orders     None         Orpah Greek, MD 06/02/22 [phone]

## 2022-06-08 ENCOUNTER — Other Ambulatory Visit: Payer: Self-pay

## 2022-06-08 ENCOUNTER — Encounter: Payer: Self-pay | Admitting: Emergency Medicine

## 2022-06-08 ENCOUNTER — Ambulatory Visit
Admission: EM | Admit: 2022-06-08 | Discharge: 2022-06-08 | Disposition: A | Discharge status: Home / Self Care | Payer: Commercial Managed Care - PPO | Attending: Nurse Practitioner | Admitting: Nurse Practitioner

## 2022-06-08 DIAGNOSIS — N898 Other specified noninflammatory disorders of vagina: Secondary | ICD-10-CM | POA: Diagnosis present

## 2022-06-08 MED ORDER — TINIDAZOLE 500 MG PO TABS: 2.0000 g | ORAL_TABLET | Freq: Every day | ORAL | 0 refills | Status: AC

## 2022-06-08 NOTE — ED Triage Notes (Signed)
Pt reports vaginal odor and intermittent vaginal itching since this am. Denies any fever, abd pain.

## 2022-06-08 NOTE — Discharge Instructions (Addendum)
Your cytology results are pending. You will be contacted if the results of your test are positive to discuss treatment. Increase condom use with each sexual encounter. Take medication as prescribed. Recommend refraining from sexual activity until you receive your test results.  If your test results are positive, you will need to refrain from sexual activity for 7 days after completing treatment. If continue to experience symptoms and your pending test results are negative and you have completed the medication, please follow-up with your primary care physician for further evaluation. Follow-up as needed.

## 2022-06-08 NOTE — ED Provider Notes (Signed)
[facility]    CSN: [id number] Arrival date & time: 06/08/22  1716      History   Chief Complaint Chief Complaint  Patient presents with   Vaginal Itching    HPI Lisa Hanna is a 37 y.o. female.   The history is provided by the patient.   She presents for complaints of vaginal odor.  Patient states that she "got wet yesterday" and had to stay in wet clothing, causing her to have vaginal odor this morning when she woke up.  She denies vaginal discharge, vaginal itching, urinary symptoms, or abdominal pain.  She reports that she has had a partial hysterectomy and therefore does not have periods.  She also reports that she has not been engaging in sexual activity.  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   LSIL (low grade squamous intraepithelial lesion) on Pap smear 11/27/2010   Status post LEEP (loop electrosurgical excision procedure) of cervix 11/27/2010    Past Surgical History:  Procedure Laterality Date   HAND SURGERY     LEEP     VAGINAL HYSTERECTOMY N/A 08/02/2017   Procedure: HYSTERECTOMY VAGINAL;  Surgeon: Florian Buff, MD;  Location: [facility];  Service: Gynecology;  Laterality: N/A;   WISDOM TOOTH EXTRACTION Bilateral     OB History     Gravida  5   Para  2   Term  2   Preterm      AB  3   Living  2      SAB  1   IAB      Ectopic      Multiple      Live Births               Home Medications    Prior to Admission medications   Medication Sig Start Date End Date Taking? Authorizing Provider  tinidazole (TINDAMAX) 500 MG tablet Take 4 tablets (2,000 mg total) by mouth daily with breakfast for 2 days.  06/08/22 06/10/22 Yes Wynn Alldredge-Warren, Alda Lea, NP  brompheniramine-pseudoephedrine-DM 30-2-10 MG/5ML syrup Take 5 mLs by mouth 4 (four) times daily as needed. 05/26/22   Mylo Driskill-Warren, Alda Lea, NP  cetirizine (ZYRTEC) 10 MG tablet Take 1 tablet (10 mg total) by mouth daily. 05/26/22   Revia Nghiem-Warren, Alda Lea, NP  clindamycin (CLEOCIN) 300 MG capsule Take 1 capsule (300 mg total) by mouth 2 (two) times daily. 02/25/22   Melynda Ripple, MD  fluconazole (DIFLUCAN) 150 MG tablet Take 1 tablet (150 mg total) by mouth daily. Take 1 tablet today, then take 1 tablet every 72 hours if symptoms persist. 04/05/22   Kyheem Bathgate-Warren, Alda Lea, NP  fluticasone (FLONASE) 50 MCG/ACT nasal spray Place 2 sprays into both nostrils daily. 04/25/19   Virgel Manifold, MD  ibuprofen (ADVIL) 600 MG tablet Take 1 tablet (600 mg total) by mouth every 6 (six) hours as needed. 01/30/21   Lamptey, Myrene Galas, MD  Lactulose 20 GM/30ML SOLN Take 30 mLs (20 g total) by mouth 2 (two) times daily as needed (for constipation). 01/13/22   Vanessa Kick, MD  methocarbamol (ROBAXIN) 500 MG tablet Take 1 tablet (500 mg total) by  mouth at bedtime as needed for muscle spasms. 01/30/21   LampteyBritta Mccreedy, MD  mupirocin ointment (BACTROBAN) 2 % Apply 1 application. topically 2 (two) times daily. 10/28/21   Deleah Tison-Warren, Sadie Haber, NP  nystatin cream (MYCOSTATIN) Apply to affected area 2 times daily x 7 days 05/30/20   Rodriguez-Southworth, Nettie Elm, PA-C  olopatadine (PATANOL) 0.1 % ophthalmic solution Place 1 drop into both eyes 2 (two) times daily. 05/26/22   Lethia Donlon-Warren, Sadie Haber, NP  polyethylene glycol (MIRALAX) 17 g packet Take 17 g by mouth daily. 01/14/22   Sabas Sous, MD  simethicone (GAS-X) 80 MG chewable tablet Chew 1 tablet (80 mg total) by mouth every 6 (six) hours as needed for flatulence. 12/28/21   Issis Lindseth-Warren, Sadie Haber, NP    Family History Family History  Problem Relation Age of Onset   Asthma Maternal Grandmother     Hypertension Maternal Grandmother    Asthma Mother    Hypertension Mother    Asthma Brother    Asthma Daughter     Social History Social History   Tobacco Use   Smoking status: Former    Packs/day: 0.25    Years: 1.00    Total pack years: 0.25    Types: Cigarettes   Smokeless tobacco: Never  Vaping Use   Vaping Use: Never used  Substance Use Topics   Alcohol use: Yes    Comment: occassional   Drug use: No     Allergies   Latex   Review of Systems Review of Systems Per HPI  Physical Exam Triage Vital Signs ED Triage Vitals [06/08/22 1840]  Enc Vitals Group     BP 124/85     Pulse Rate 81     Resp 18     Temp 98.8 F (37.1 C)     Temp Source Oral     SpO2 97 %     Weight      Height      Head Circumference      Peak Flow      Pain Score 0     Pain Loc      Pain Edu?      Excl. in [place]?    No data found.  Updated Vital Signs BP 124/85 (BP Location: Right Arm)   Pulse 81   Temp 98.8 F (37.1 C) (Oral)   Resp 18   LMP 07/23/2017   SpO2 97%   Visual Acuity Right Eye Distance:   Left Eye Distance:   Bilateral Distance:    Right Eye Near:   Left Eye Near:    Bilateral Near:     Physical Exam Vitals and nursing note reviewed.  Constitutional:      General: She is not in acute distress.    Appearance: Normal appearance.  Eyes:     Extraocular Movements: Extraocular movements intact.     Pupils: Pupils are equal, round, and reactive to light.  Cardiovascular:     Rate and Rhythm: Normal rate and regular rhythm.     Pulses: Normal pulses.     Heart sounds: Normal heart sounds.  Pulmonary:     Effort: Pulmonary effort is normal.     Breath sounds: Normal breath sounds.  Abdominal:     General: Bowel sounds are normal.     Palpations: Abdomen is soft.     Tenderness: There is no abdominal tenderness.  Genitourinary:    Comments: GU exam deferred, self swab performed   Musculoskeletal:  Cervical back: Normal range of motion.   Lymphadenopathy:     Cervical: No cervical adenopathy.  Skin:    General: Skin is warm and dry.  Neurological:     General: No focal deficit present.     Mental Status: She is alert and oriented to person, place, and time.  Psychiatric:        Mood and Affect: Mood normal.      UC Treatments / Results  Labs (all labs ordered are listed, but only abnormal results are displayed) Labs Reviewed  CERVICOVAGINAL ANCILLARY ONLY    EKG   Radiology No results found.  Procedures Procedures (including critical care time)  Medications Ordered in UC Medications - No data to display  Initial Impression / Assessment and Plan / UC Course  I have reviewed the triage vital signs and the nursing notes.  Pertinent labs & imaging results that were available during my care of the patient were reviewed by me and considered in my medical decision making (see chart for details).     Cytology swabs are pending.  For patient's vaginal odor history of recurrent BV, will treat with tinidazole 500 mg.  Patient was advised she will be contacted if the pending test results of her cytology swabs are positive.  Patient advised that she will need to refrain from sexual activity 7 days after treatment is completed if her testing is positive.  Patient verbalizes understanding.  All questions were answered.  Patient stable for discharge. Final Clinical Impressions(s) / UC Diagnoses   Final diagnoses:  Vaginal odor     Discharge Instructions      Your cytology results are pending. You will be contacted if the results of your test are positive to discuss treatment. Increase condom use with each sexual encounter. Take medication as prescribed. Recommend refraining from sexual activity until you receive your test results.  If your test results are positive, you will need to refrain from sexual activity for 7 days after completing treatment. If continue to experience symptoms and your pending test  results are negative and you have completed the medication, please follow-up with your primary care physician for further evaluation. Follow-up as needed.    ED Prescriptions     Medication Sig Dispense Auth. Provider   tinidazole (TINDAMAX) 500 MG tablet Take 4 tablets (2,000 mg total) by mouth daily with breakfast for 2 days. 8 tablet Donn Zanetti-Warren, Alda Lea, NP      PDMP not reviewed this encounter.   Tish Men, NP 06/08/22 1920

## 2022-06-09 LAB — CERVICOVAGINAL ANCILLARY ONLY
Bacterial Vaginitis (gardnerella): POSITIVE — AB
Candida Glabrata: NEGATIVE
Candida Vaginitis: NEGATIVE
Chlamydia: NEGATIVE
Comment: NEGATIVE
Comment: NEGATIVE
Comment: NEGATIVE
Comment: NEGATIVE
Comment: NEGATIVE
Comment: NORMAL
Neisseria Gonorrhea: NEGATIVE
Trichomonas: NEGATIVE

## 2022-07-08 ENCOUNTER — Ambulatory Visit
Admission: EM | Admit: 2022-07-08 | Discharge: 2022-07-08 | Disposition: A | Discharge status: Home / Self Care | Payer: Commercial Managed Care - PPO | Attending: Nurse Practitioner | Admitting: Nurse Practitioner

## 2022-07-08 DIAGNOSIS — H04129 Dry eye syndrome of unspecified lacrimal gland: Secondary | ICD-10-CM

## 2022-07-08 DIAGNOSIS — H1031 Unspecified acute conjunctivitis, right eye: Secondary | ICD-10-CM

## 2022-07-08 MED ORDER — OFLOXACIN 0.3 % OP SOLN: 1.0000 [drp] | Freq: Four times a day (QID) | OPHTHALMIC | 0 refills | Status: DC

## 2022-07-08 NOTE — ED Provider Notes (Signed)
[facility]    CSN: [id number] Arrival date & time: 07/08/22  0935      History   Chief Complaint No chief complaint on file.   HPI Lisa Hanna is a 37 y.o. female.   HPI She is in today for right eye pain an irritation. 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 She is having blurred vision. She denies any active eye pain. She denies any recent injury . She has had previous injury to her right eye. She has a dx of dry eye. She is not treating. She does not wear glasses routinely but at night or watching tv.  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   LSIL (low grade squamous intraepithelial lesion) on Pap smear 11/27/2010   Status post LEEP (loop electrosurgical excision procedure) of cervix 11/27/2010    Past Surgical History:  Procedure Laterality Date   HAND SURGERY     LEEP     VAGINAL HYSTERECTOMY N/A 08/02/2017   Procedure: HYSTERECTOMY VAGINAL;  Surgeon: Florian Buff, MD;  Location: [facility];  Service: Gynecology;  Laterality: N/A;   WISDOM TOOTH EXTRACTION Bilateral     OB History     Gravida  5   Para  2   Term  2   Preterm      AB  3   Living  2      SAB  1   IAB      Ectopic      Multiple      Live Births               Home Medications    Prior to Admission medications   Medication Sig Start Date End Date Taking? Authorizing Provider  ofloxacin (OCUFLOX) 0.3 % ophthalmic solution Place 1 drop into the right eye 4 (four) times daily. 07/08/22   Vevelyn Francois, NP    Family History Family History  Problem Relation Age of Onset    Asthma Maternal Grandmother    Hypertension Maternal Grandmother    Asthma Mother    Hypertension Mother    Asthma Brother    Asthma Daughter     Social History Social History   Tobacco Use   Smoking status: Former    Packs/day: 0.25    Years: 1.00    Total pack years: 0.25    Types: Cigarettes   Smokeless tobacco: Never  Vaping Use   Vaping Use: Never used  Substance Use Topics   Alcohol use: Yes    Comment: occassional   Drug use: No     Allergies   Latex   Review of Systems Review of Systems   Physical Exam Triage Vital Signs ED Triage Vitals [07/08/22 0958]  Enc Vitals Group     BP 107/70     Pulse Rate 95     Resp 20     Temp 98.1 F (36.7 C)     Temp Source Oral     SpO2 94 %     Weight      Height  Head Circumference      Peak Flow      Pain Score      Pain Loc      Pain Edu?      Excl. in [place]?    No data found.  Updated Vital Signs BP 107/70 (BP Location: Right Arm)   Pulse 95   Temp 98.1 F (36.7 C) (Oral)   Resp 20   LMP 07/23/2017   SpO2 94%   Visual Acuity Right Eye Distance:   Left Eye Distance:   Bilateral Distance:    Right Eye Near:   Left Eye Near:    Bilateral Near:     Physical Exam Constitutional:      General: She is not in acute distress.    Appearance: She is normal weight.  HENT:     Head: Normocephalic and atraumatic.  Eyes:     General:        Right eye: Discharge present.     Extraocular Movements: Extraocular movements intact.     Pupils: Pupils are equal, round, and reactive to light.     Comments: Light sensitivity   Cardiovascular:     Rate and Rhythm: Normal rate.  Pulmonary:     Effort: Pulmonary effort is normal.  Skin:    General: Skin is warm.     Capillary Refill: Capillary refill takes less than 2 seconds.  Neurological:     General: No focal deficit present.     Mental Status: She is alert.  Psychiatric:        Mood and Affect: Mood normal.        Behavior: Behavior normal.       UC Treatments / Results  Labs (all labs ordered are listed, but only abnormal results are displayed) Labs Reviewed - No data to display  EKG   Radiology No results found.  Procedures Procedures (including critical care time)  Medications Ordered in UC Medications - No data to display  Initial Impression / Assessment and Plan / UC Course  I have reviewed the triage vital signs and the nursing notes.  Pertinent labs & imaging results that were available during my care of the patient were reviewed by me and considered in my medical decision making (see chart for details).     Right eye discharge Final Clinical Impressions(s) / UC Diagnoses   Final diagnoses:  Acute conjunctivitis of right eye, unspecified acute conjunctivitis type  Dry eye     Discharge Instructions      You have been prescribed Ofloxacin for the acute conjunctivitis , use as directed. Pat-a-day is recommended for allergy eye symptoms.  Visine Dry eye is recommended for dry eye symptoms. Follow up with PCP as scheduled. Also recommend following up with opthalmology if symptoms persist.     ED Prescriptions     Medication Sig Dispense Auth. Provider   ofloxacin (OCUFLOX) 0.3 % ophthalmic solution  (Status: Discontinued) Place 1 drop into the left eye 4 (four) times daily. 5 mL Vevelyn Francois, NP   ofloxacin (OCUFLOX) 0.3 % ophthalmic solution Place 1 drop into the right eye 4 (four) times daily. 5 mL Vevelyn Francois, NP      PDMP not reviewed this encounter.   Dionisio David [place], [place] 07/08/22 1113

## 2022-07-08 NOTE — ED Triage Notes (Signed)
Pt reports waking up with her right eye slightly swollen with green drainage today. Reports eye feels dry

## 2022-07-08 NOTE — Discharge Instructions (Addendum)
You have been prescribed Ofloxacin for the acute conjunctivitis , use as directed. Pat-a-day is recommended for allergy eye symptoms.  Visine Dry eye is recommended for dry eye symptoms. Follow up with PCP as scheduled. Also recommend following up with opthalmology if symptoms persist.

## 2022-07-18 ENCOUNTER — Other Ambulatory Visit (HOSPITAL_COMMUNITY): Payer: Self-pay | Admitting: Family Medicine

## 2022-07-18 DIAGNOSIS — N6320 Unspecified lump in the left breast, unspecified quadrant: Secondary | ICD-10-CM

## 2022-08-04 ENCOUNTER — Ambulatory Visit (HOSPITAL_COMMUNITY)
Admission: RE | Admit: 2022-08-04 | Discharge: 2022-08-04 | Disposition: A | Discharge status: Home / Self Care | Payer: Commercial Managed Care - PPO | Source: Ambulatory Visit | Attending: Family Medicine | Admitting: Family Medicine

## 2022-08-04 DIAGNOSIS — N6320 Unspecified lump in the left breast, unspecified quadrant: Secondary | ICD-10-CM | POA: Insufficient documentation

## 2022-09-30 ENCOUNTER — Ambulatory Visit
Admission: EM | Admit: 2022-09-30 | Discharge: 2022-09-30 | Disposition: A | Discharge status: Home / Self Care | Payer: Commercial Managed Care - PPO | Attending: Physician Assistant | Admitting: Physician Assistant

## 2022-09-30 DIAGNOSIS — R5383 Other fatigue: Secondary | ICD-10-CM | POA: Insufficient documentation

## 2022-09-30 DIAGNOSIS — R5381 Other malaise: Secondary | ICD-10-CM | POA: Insufficient documentation

## 2022-09-30 DIAGNOSIS — N898 Other specified noninflammatory disorders of vagina: Secondary | ICD-10-CM | POA: Diagnosis present

## 2022-09-30 MED ORDER — METRONIDAZOLE 0.75 % VA GEL: 1.0000 | Freq: Every day | VAGINAL | 0 refills | Status: DC

## 2022-09-30 NOTE — ED Provider Notes (Signed)
[facility]    CSN: [id number] Arrival date & time: 09/30/22  1421      History   Chief Complaint Chief Complaint  Patient presents with   Weakness    HPI Lisa Hanna is a 37 y.o. female.   Patient presents today with a 2-week history of fatigue and malaise.  Reports that she has generally been feeling weak but denies any focal weakness or other neurological symptoms including dysarthria, headaches, visual disturbance.  She denies any recent illness or fever, cough, congestion.  She does report that she has been more stressed at her place of employment.  This is the same drop as she has a new boss and she feels that this could be causing her to have panic attacks and increased stress.  She has had difficulty sleeping as result.  She has not tried any over-the-counter sleep aids.  She denies any significant blood loss including melena or hematochezia.  She is status post hysterectomy.  She does have a history of vitamin D deficiency and reports that she often feels weak and tired whenever this is low.  She is requesting recheck this with additional lab work today.  In addition, she reports a several day history of vaginal odor.  Denies any associated discharge, pelvic pain, nausea, vomiting.  Denies any changes to personal hygiene products including soaps or detergents.  She does have a history of recurrent bacterial vaginosis and believes that this episode was triggered by being outside and sweating.  She has not tried any over-the-counter medication for symptom management.  She has not been sexually active recently and has no specific concern for STI.    Past Medical History:  Diagnosis Date   Miscarriage    Panic attack    Vaginal Pap smear, abnormal     Patient Active Problem List   Diagnosis Date Noted   BV (bacterial vaginosis) 10/30/2018   Vaginal itching 10/30/2018   Vaginal odor 10/30/2018   S/P vaginal hysterectomy 08/02/2017   Low grade squamous  intraepithelial lesion (LGSIL) 07/07/2014   LSIL (low grade squamous intraepithelial lesion) on Pap smear 11/27/2010   Status post LEEP (loop electrosurgical excision procedure) of cervix 11/27/2010    Past Surgical History:  Procedure Laterality Date   HAND SURGERY     LEEP     VAGINAL HYSTERECTOMY N/A 08/02/2017   Procedure: HYSTERECTOMY VAGINAL;  Surgeon: Lazaro Arms, MD;  Location: [facility];  Service: Gynecology;  Laterality: N/A;   WISDOM TOOTH EXTRACTION Bilateral     OB History     Gravida  5   Para  2   Term  2   Preterm      AB  3   Living  2      SAB  1   IAB      Ectopic      Multiple      Live Births               Home Medications    Prior to Admission medications   Medication Sig Start Date End Date Taking? Authorizing Provider  metroNIDAZOLE (METROGEL) 0.75 % vaginal gel Place 1 Applicatorful vaginally at bedtime. 09/30/22  Yes Leydy Worthey, Noberto Retort, PA-C    Family History Family History  Problem Relation Age of Onset   Asthma Maternal Grandmother    Hypertension Maternal Grandmother    Asthma Mother    Hypertension Mother    Asthma Brother    Asthma Daughter  Social History Social History   Tobacco Use   Smoking status: Former    Packs/day: 0.25    Years: 1.00    Additional pack years: 0.00    Total pack years: 0.25    Types: Cigarettes   Smokeless tobacco: Never  Vaping Use   Vaping Use: Never used  Substance Use Topics   Alcohol use: Yes    Comment: occassional   Drug use: No     Allergies   Latex   Review of Systems Review of Systems  Constitutional:  Positive for activity change and fatigue. Negative for appetite change and fever.  Respiratory:  Negative for cough and shortness of breath.   Cardiovascular:  Negative for chest pain.  Gastrointestinal:  Negative for abdominal pain, diarrhea, nausea and vomiting.  Genitourinary:  Negative for dysuria, frequency, pelvic pain, urgency, vaginal bleeding, vaginal  discharge and vaginal pain.     Physical Exam Triage Vital Signs ED Triage Vitals  Enc Vitals Group     BP 09/30/22 1456 100/70     Pulse Rate 09/30/22 1456 95     Resp 09/30/22 1456 16     Temp --      Temp Source 09/30/22 1456 Oral     SpO2 09/30/22 1456 97 %     Weight --      Height --      Head Circumference --      Peak Flow --      Pain Score 09/30/22 1459 0     Pain Loc --      Pain Edu? --      Excl. in [place]? --    No data found.  Updated Vital Signs BP 100/70 (BP Location: Right Arm)   Pulse 95   Resp 16   LMP 07/23/2017   SpO2 97%   Visual Acuity Right Eye Distance:   Left Eye Distance:   Bilateral Distance:    Right Eye Near:   Left Eye Near:    Bilateral Near:     Physical Exam Vitals reviewed.  Constitutional:      General: She is awake. She is not in acute distress.    Appearance: Normal appearance. She is well-developed. She is not ill-appearing.     Comments: Very pleasant female appears stated age in no acute distress sitting comfortably in exam room  HENT:     Head: Normocephalic and atraumatic.     Right Ear: Tympanic membrane, ear canal and external ear normal. Tympanic membrane is not erythematous or bulging.     Left Ear: Tympanic membrane, ear canal and external ear normal. Tympanic membrane is not erythematous or bulging.     Nose:     Right Sinus: No maxillary sinus tenderness or frontal sinus tenderness.     Left Sinus: No maxillary sinus tenderness or frontal sinus tenderness.     Mouth/Throat:     Pharynx: Uvula midline. No oropharyngeal exudate or posterior oropharyngeal erythema.  Cardiovascular:     Rate and Rhythm: Normal rate and regular rhythm.     Heart sounds: Normal heart sounds, S1 normal and S2 normal. No murmur heard. Pulmonary:     Effort: Pulmonary effort is normal.     Breath sounds: Normal breath sounds. No wheezing, rhonchi or rales.     Comments: Clear to auscultation bilaterally Abdominal:     General: Bowel  sounds are normal.     Palpations: Abdomen is soft.     Tenderness: There is no abdominal  tenderness.  Psychiatric:        Behavior: Behavior is cooperative.      UC Treatments / Results  Labs (all labs ordered are listed, but only abnormal results are displayed) Labs Reviewed  CBC WITH DIFFERENTIAL/PLATELET  COMPREHENSIVE METABOLIC PANEL  TSH  VITAMIN D 25 HYDROXY (VIT D DEFICIENCY, FRACTURES)  CERVICOVAGINAL ANCILLARY ONLY    EKG   Radiology No results found.  Procedures Procedures (including critical care time)  Medications Ordered in UC Medications - No data to display  Initial Impression / Assessment and Plan / UC Course  I have reviewed the triage vital signs and the nursing notes.  Pertinent labs & imaging results that were available during my care of the patient were reviewed by me and considered in my medical decision making (see chart for details).     Patient is well-appearing, afebrile, nontoxic, nontachycardic.  Vital signs and physical exam are reassuring with no indication for emergent evaluation or imaging.  I suspect symptoms are related to increased work stressors and decreased sleep.  Recommend she use over-the-counter melatonin and sleep hygiene practices to encourage a full night sleep.  Will obtain basic blood work including CBC, CMP, thyroid, vitamin D levels to ensure there is no an additional underlying cause contributing to her symptoms.  She was instructed to rest and drink plenty of fluid.  Recommend that she eat small frequent meals.  Discussed that if her symptoms are not improving, do not find an obvious cause she should follow-up with her PCP for further evaluation and management.  Strict return precautions given.  Work excuse note provided.  Will empirically treat for bacterial vaginosis.  STI swab was collected and is pending.  Patient prefers MetroGel over will metronidazole due to side effects.  We discussed that she should still abstain  from alcohol consumption due to need to be side effects associated with metronidazole.  She is to wear loosefitting cotton underwear and use hypoallergenic soaps and detergents.  We will contact her if we need to arrange any additional treatment based on her swab results.  Strict return precautions given.  Final Clinical Impressions(s) / UC Diagnoses   Final diagnoses:  Malaise and fatigue  Vaginal odor     Discharge Instructions      Monitor your MyChart for your results.  We will contact you if anything is abnormal.  I recommend over-the-counter melatonin as well as going to bed at the same time every night.  Follow-up with your primary care.  If anything worsens please return for reevaluation.  Start metronidazole gel.  Do not drink alcohol while on this medication.  Wear loosefitting cotton underwear and use hypoallergenic soaps and detergents.  We will contact you if your swab is positive we will need to arrange any additional treatment.     ED Prescriptions     Medication Sig Dispense Auth. Provider   metroNIDAZOLE (METROGEL) 0.75 % vaginal gel Place 1 Applicatorful vaginally at bedtime. 70 g Ravon Mortellaro, Noberto Retort, PA-C      PDMP not reviewed this encounter.   Jeani Hawking, PA-C 09/30/22 1554

## 2022-09-30 NOTE — ED Triage Notes (Signed)
Pt reports low energy, fatigue x 2 weeks.   Pt  reports fishy vaginal smell x 2 week. Denies discharge.

## 2022-09-30 NOTE — Discharge Instructions (Signed)
Monitor your MyChart for your results.  We will contact you if anything is abnormal.  I recommend over-the-counter melatonin as well as going to bed at the same time every night.  Follow-up with your primary care.  If anything worsens please return for reevaluation.  Start metronidazole gel.  Do not drink alcohol while on this medication.  Wear loosefitting cotton underwear and use hypoallergenic soaps and detergents.  We will contact you if your swab is positive we will need to arrange any additional treatment.

## 2022-10-01 LAB — COMPREHENSIVE METABOLIC PANEL
ALT: 21 IU/L (ref 0–32)
AST: 18 IU/L (ref 0–40)
Albumin/Globulin Ratio: 1.8 (ref 1.2–2.2)
Albumin: 4.4 g/dL (ref 3.9–4.9)
Alkaline Phosphatase: 113 IU/L (ref 44–121)
BUN/Creatinine Ratio: 9 (ref 9–23)
BUN: 8 mg/dL (ref 6–20)
Bilirubin Total: 0.3 mg/dL (ref 0.0–1.2)
CO2: 21 mmol/L (ref 20–29)
Calcium: 9 mg/dL (ref 8.7–10.2)
Chloride: 104 mmol/L (ref 96–106)
Creatinine, Ser: 0.94 mg/dL (ref 0.57–1.00)
Globulin, Total: 2.4 g/dL (ref 1.5–4.5)
Glucose: 74 mg/dL (ref 70–99)
Potassium: 3.9 mmol/L (ref 3.5–5.2)
Sodium: 142 mmol/L (ref 134–144)
Total Protein: 6.8 g/dL (ref 6.0–8.5)
eGFR: 80 mL/min/{1.73_m2} (ref >59)

## 2022-10-01 LAB — CBC WITH DIFFERENTIAL/PLATELET
Basophils Absolute: 0.1 10*3/uL (ref 0.0–0.2)
Basos: 1 %
EOS (ABSOLUTE): 0.1 10*3/uL (ref 0.0–0.4)
Eos: 3 %
Hematocrit: 38.6 % (ref 34.0–46.6)
Hemoglobin: 13.2 g/dL (ref 11.1–15.9)
Immature Grans (Abs): 0 10*3/uL (ref 0.0–0.1)
Immature Granulocytes: 0 %
Lymphocytes Absolute: 1.7 10*3/uL (ref 0.7–3.1)
Lymphs: 46 %
MCH: 35.2 pg — ABNORMAL HIGH (ref 26.6–33.0)
MCHC: 34.2 g/dL (ref 31.5–35.7)
MCV: 103 fL — ABNORMAL HIGH (ref 79–97)
Monocytes Absolute: 0.4 10*3/uL (ref 0.1–0.9)
Monocytes: 12 %
Neutrophils Absolute: 1.4 10*3/uL (ref 1.4–7.0)
Neutrophils: 38 %
Platelets: 366 10*3/uL (ref 150–450)
RBC: 3.75 x10E6/uL — ABNORMAL LOW (ref 3.77–5.28)
RDW: 11.6 % — ABNORMAL LOW (ref 11.7–15.4)
WBC: 3.7 10*3/uL (ref 3.4–10.8)

## 2022-10-01 LAB — TSH: TSH: 0.612 u[IU]/mL (ref 0.450–4.500)

## 2022-10-01 LAB — VITAMIN D 25 HYDROXY (VIT D DEFICIENCY, FRACTURES): Vit D, 25-Hydroxy: 16.9 ng/mL — ABNORMAL LOW (ref 30.0–100.0)

## 2022-10-04 LAB — CERVICOVAGINAL ANCILLARY ONLY
Bacterial Vaginitis (gardnerella): POSITIVE — AB
Candida Glabrata: NEGATIVE
Candida Vaginitis: NEGATIVE
Chlamydia: NEGATIVE
Comment: NEGATIVE
Comment: NEGATIVE
Comment: NEGATIVE
Comment: NEGATIVE
Comment: NEGATIVE
Comment: NORMAL
Neisseria Gonorrhea: NEGATIVE
Trichomonas: NEGATIVE

## 2023-01-26 ENCOUNTER — Encounter: Payer: Medicaid Other | Admitting: Obstetrics & Gynecology

## 2023-01-28 ENCOUNTER — Telehealth: Payer: Medicaid Other | Admitting: Nurse Practitioner

## 2023-01-28 DIAGNOSIS — L239 Allergic contact dermatitis, unspecified cause: Secondary | ICD-10-CM | POA: Diagnosis not present

## 2023-01-28 MED ORDER — PREDNISONE 20 MG PO TABS: 20.0000 mg | ORAL_TABLET | Freq: Every day | ORAL | 0 refills | Status: AC

## 2023-01-28 NOTE — Progress Notes (Signed)
Virtual Visit Consent   Lisa Hanna, you are scheduled for a virtual visit with a [facility] provider today. Just as with appointments in the office, your consent must be obtained to participate. Your consent will be active for this visit and any virtual visit you may have with one of our providers in the next 365 days. If you have a MyChart account, a copy of this consent can be sent to you electronically.  As this is a virtual visit, video technology does not allow for your provider to perform a traditional examination. This may limit your provider's ability to fully assess your condition. If your provider identifies any concerns that need to be evaluated in person or the need to arrange testing (such as labs, EKG, etc.), we will make arrangements to do so. Although advances in technology are sophisticated, we cannot ensure that it will always work on either your end or our end. If the connection with a video visit is poor, the visit may have to be switched to a telephone visit. With either a video or telephone visit, we are not always able to ensure that we have a secure connection.  By engaging in this virtual visit, you consent to the provision of healthcare and authorize for your insurance to be billed (if applicable) for the services provided during this visit. Depending on your insurance coverage, you may receive a charge related to this service.  I need to obtain your verbal consent now. Are you willing to proceed with your visit today? [name] has provided verbal consent on 01/28/2023 for a virtual visit (video or telephone). Claiborne Rigg, NP  Date: 01/28/2023 2:37 PM  Virtual Visit via Video Note   I, Claiborne Rigg, connected with  Lisa Hanna  ([id number], [birth year]) on 01/28/23 at  2:30 PM EDT by a video-enabled telemedicine application and verified that I am speaking with the correct person using two identifiers.  Location: Patient: Virtual Visit  Location Patient: Home Provider: Virtual Visit Location Provider: Home Office   I discussed the limitations of evaluation and management by telemedicine and the availability of in person appointments. The patient expressed understanding and agreed to proceed.    History of Present Illness: Lisa Hanna is a 37 y.o. who identifies as a female who was assigned female at birth, and is being seen today for allergic reaction.  Ms. Cocroft states she used a new oil on her scalp/in her hair yesterday and woke up today with numbness in lips, itchy red rash on her face and swollen, watery eyes.  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   LSIL (low grade squamous intraepithelial lesion) on Pap smear 11/27/2010   Status post LEEP (loop electrosurgical excision procedure) of cervix 11/27/2010    Allergies:  Allergies  Allergen Reactions   Latex Dermatitis   Medications:  Current Outpatient Medications:    predniSONE (DELTASONE) 20 MG tablet, Take 1 tablet (20 mg total) by mouth daily with breakfast for 5 days., Disp: 5 tablet, Rfl: 0   metroNIDAZOLE (METROGEL) 0.75 % vaginal gel, Place 1 Applicatorful vaginally at bedtime., Disp: 70 g, Rfl: 0  Observations/Objective: Patient is well-developed, well-nourished in no acute distress.  Resting comfortably at home.  Head is normocephalic, atraumatic.  No labored breathing.  Speech is clear and coherent with logical content.  Patient is alert and oriented at baseline.    Assessment and Plan: 1. Contact allergic reaction - predniSONE (DELTASONE) 20 MG tablet; Take 1 tablet (20 mg total) by mouth daily with breakfast for 5 days.  Dispense: 5 tablet; Refill: 0   Follow Up Instructions: I discussed the assessment and  treatment plan with the patient. The patient was provided an opportunity to ask questions and all were answered. The patient agreed with the plan and demonstrated an understanding of the instructions.  A copy of instructions were sent to the patient via MyChart unless otherwise noted below.    The patient was advised to call back or seek an in-person evaluation if the symptoms worsen or if the condition fails to improve as anticipated.  Time:  I spent 12 minutes with the patient via telehealth technology discussing the above problems/concerns.    Claiborne Rigg, NP

## 2023-01-28 NOTE — Patient Instructions (Signed)
  Lisa Hanna, thank you for joining Claiborne Rigg, NP for today's virtual visit.  While this provider is not your primary care provider (PCP), if your PCP is located in our provider database this encounter information will be shared with them immediately following your visit.   A [facility] MyChart account gives you access to today's visit and all your visits, tests, and labs performed at [facility] " click here if you don't have a [facility] MyChart account or go to [email or web address]  Consent: (Patient) Lisa Hanna provided verbal consent for this virtual visit at the beginning of the encounter.  Current Medications:  Current Outpatient Medications:    predniSONE (DELTASONE) 20 MG tablet, Take 1 tablet (20 mg total) by mouth daily with breakfast for 5 days., Disp: 5 tablet, Rfl: 0   metroNIDAZOLE (METROGEL) 0.75 % vaginal gel, Place 1 Applicatorful vaginally at bedtime., Disp: 70 g, Rfl: 0   Medications ordered in this encounter:  Meds ordered this encounter  Medications   predniSONE (DELTASONE) 20 MG tablet    Sig: Take 1 tablet (20 mg total) by mouth daily with breakfast for 5 days.    Dispense:  5 tablet    Refill:  0    Order Specific Question:   Supervising Provider    Answer:   Merrilee Jansky [id number]     *If you need refills on other medications prior to your next appointment, please contact your pharmacy*  Follow-Up: Call back or seek an in-person evaluation if the symptoms worsen or if the condition fails to improve as anticipated.  [facility] [phone]    If you have been instructed to have an in-person evaluation today at a local Urgent Care facility, please use the link below. It will take you to a list of all of our available [facility], including address, phone number and hours of operation. Please do not delay care.  [facility]  If you or a family member do not have a  primary care provider, use the link below to schedule a visit and establish care. When you choose a [facility] primary care physician or advanced practice provider, you gain a long-term partner in health. Find a Primary Care Provider  Learn more about [facility]'s in-office and virtual care options: [facility] Get Care Now

## 2023-04-02 IMAGING — DX DG FINGER THUMB 2+V*R*
3 series · 3 of 3 positions shown · non-contrast
Comparison: None.

CLINICAL DATA: Trauma to the right thumb.

EXAM:
RIGHT THUMB 2+V

[finger ap]
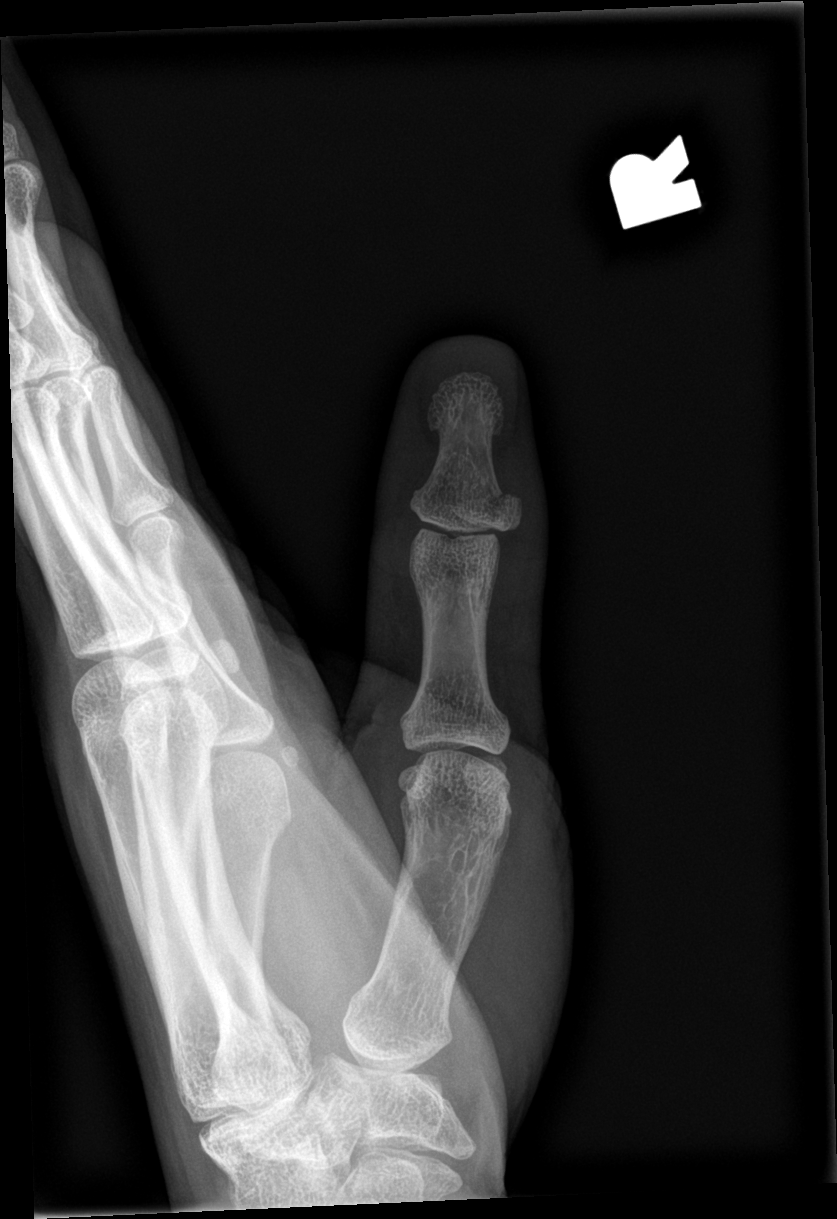

[finger obl]
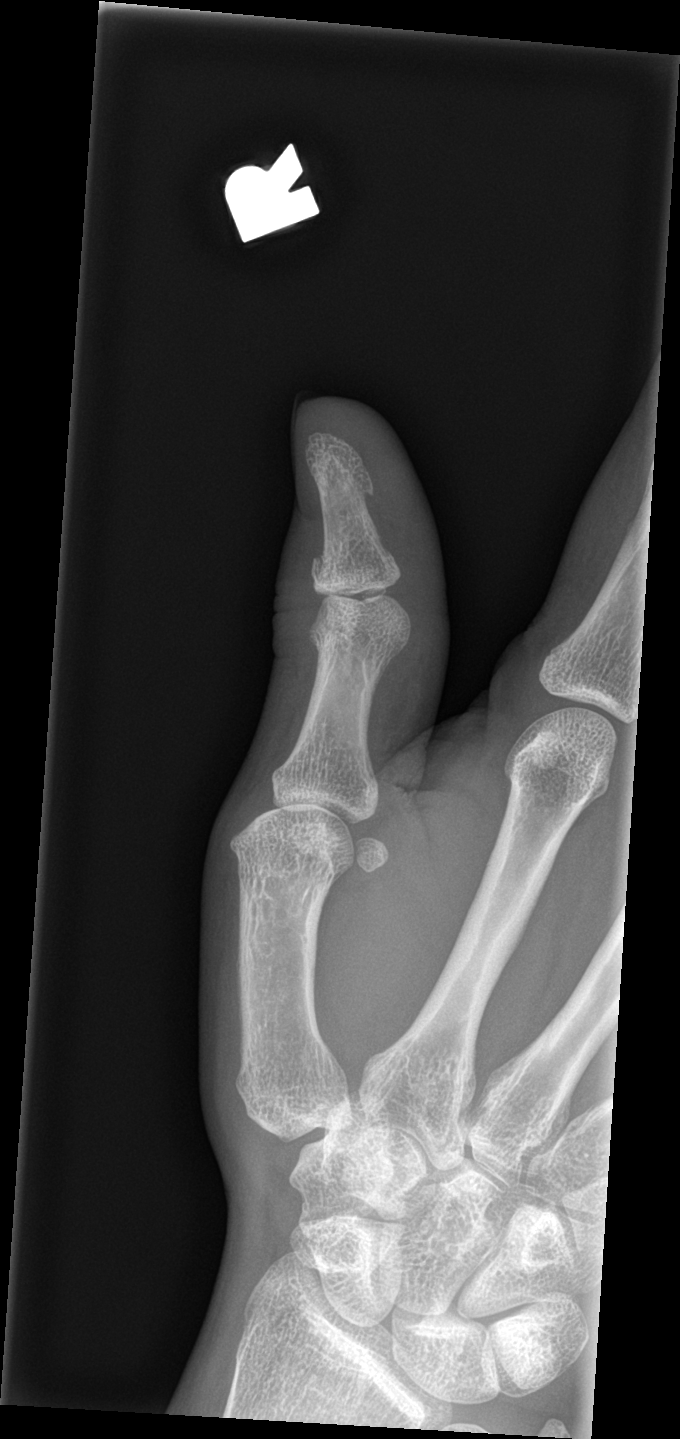

[finger lat]
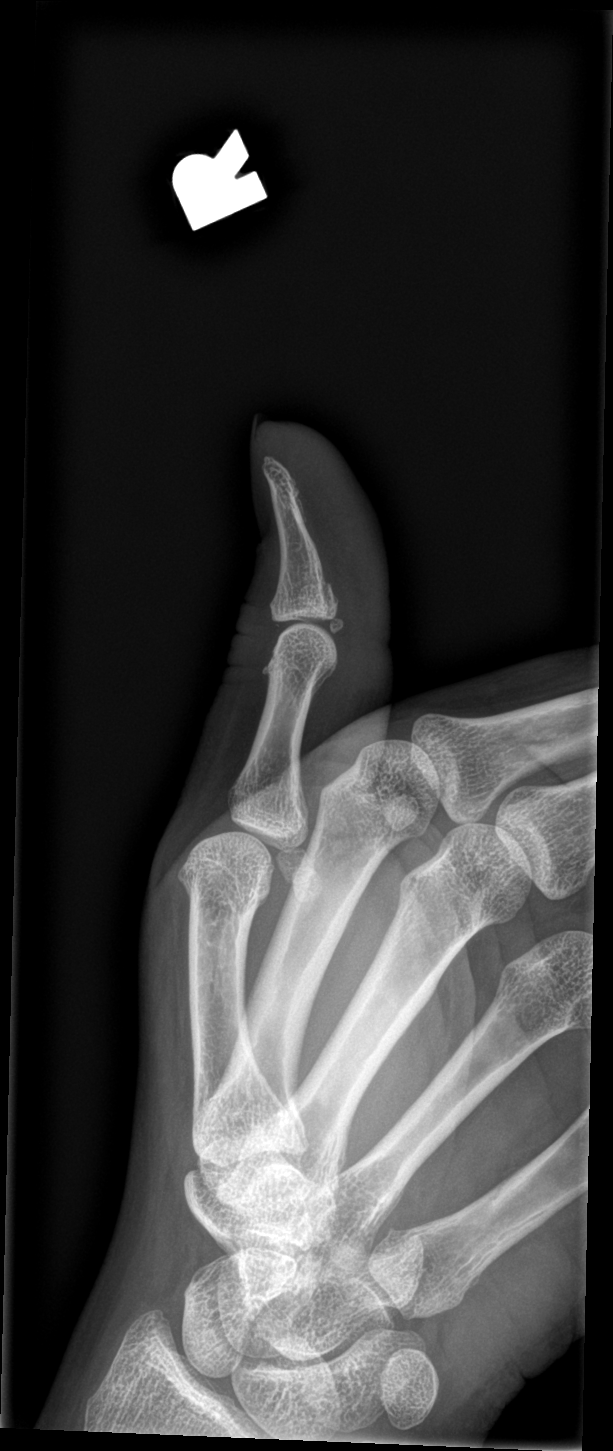

[3 of 3 positions shown; findings below may reference images not displayed]

FINDINGS: Minimally displaced intra-articular fracture the base of the distal
phalanx of the thumb. No other acute fracture. There is no
dislocation. The bones are well mineralized. No arthritic changes.
Mild soft tissue swelling of the thumb. No radiopaque foreign object
or soft tissue gas.
IMPRESSION: Minimally displaced intra-articular fracture of the base of the
distal phalanx of the thumb.

## 2023-04-07 ENCOUNTER — Telehealth: Payer: Medicaid Other | Admitting: Physician Assistant

## 2023-04-07 NOTE — Progress Notes (Signed)
The patient no-showed for appointment despite this provider sending direct link with no response and waiting for at least 10 minutes from appointment time for patient to join. They will be marked as a NS for this appointment/time.   Maryon Kemnitz M Kenzie Flakes, PA-C    

## 2023-06-21 ENCOUNTER — Other Ambulatory Visit (HOSPITAL_COMMUNITY): Payer: Self-pay | Admitting: Family Medicine

## 2023-06-21 DIAGNOSIS — Z1231 Encounter for screening mammogram for malignant neoplasm of breast: Secondary | ICD-10-CM

## 2023-06-29 ENCOUNTER — Ambulatory Visit
Admission: EM | Admit: 2023-06-29 | Discharge: 2023-06-29 | Disposition: A | Discharge status: Home / Self Care | Payer: Medicaid Other | Attending: Family Medicine | Admitting: Family Medicine

## 2023-06-29 ENCOUNTER — Encounter: Payer: Self-pay | Admitting: Emergency Medicine

## 2023-06-29 ENCOUNTER — Other Ambulatory Visit: Payer: Self-pay

## 2023-06-29 DIAGNOSIS — R1011 Right upper quadrant pain: Secondary | ICD-10-CM

## 2023-06-29 LAB — POCT URINALYSIS DIP (MANUAL ENTRY)
Bilirubin, UA: NEGATIVE
Blood, UA: NEGATIVE
Glucose, UA: NEGATIVE mg/dL
Ketones, POC UA: NEGATIVE mg/dL
Leukocytes, UA: NEGATIVE
Nitrite, UA: NEGATIVE
Protein Ur, POC: NEGATIVE mg/dL
Spec Grav, UA: 1.01 (ref 1.010–1.025)
Urobilinogen, UA: 0.2 U/dL
pH, UA: 5.5 (ref 5.0–8.0)

## 2023-06-29 NOTE — Discharge Instructions (Addendum)
Urine sample you give [place] today looks great-no signs of infection.  As we discussed, the pain you are having is consistent with gallbladder inflammation or gallstones.  Recommend low-fat diet, pushing hydration plenty of fluids and electrolytes, and Tylenol 500 to 1000 mg every 6 hours as needed for pain.  We have checked blood work today and will contact you tomorrow if the results are abnormal.  Recommend follow-up with your primary care provider to further evaluate and manage your symptoms if they persist over the next couple of days.  If symptoms worsen and you develop severe pain, nausea/vomiting and are unable to keep fluids down, please seek care in the emergency room.

## 2023-06-29 NOTE — ED Provider Notes (Signed)
[facility]    CSN: [id number] Arrival date & time: 06/29/23  1401      History   Chief Complaint Chief Complaint  Patient presents with   Abdominal Pain    HPI Lisa Hanna is a 38 y.o. female.   Patient presents today with 4-day history of intermittent, right upper quadrant abdominal pain.  She reports the pain comes and goes and last for a few minutes when it comes.  She describes the pain as a sharp burning pain and rates the pain as an 8 out of 10.  Reports when she stretches a certain way, it feels a little bit better.  Reports pain is sometimes worse after eating.  She denies fever, nausea/vomiting, change in appetite, burning with urination, increased urinary frequency or urgency, and hematuria.  She has had some diarrhea since the pain began but denies more than 10 episodes of diarrhea in a day.  Denies blood in the stool.  Denies frequent NSAID use.    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   LSIL (low grade squamous intraepithelial lesion) on Pap smear 11/27/2010   Status post LEEP (loop electrosurgical excision procedure) of cervix 11/27/2010    Past Surgical History:  Procedure Laterality Date   HAND SURGERY     LEEP     VAGINAL HYSTERECTOMY N/A 08/02/2017   Procedure: HYSTERECTOMY VAGINAL;  Surgeon: Lazaro Arms, MD;  Location: [facility];  Service: Gynecology;  Laterality: N/A;   WISDOM TOOTH EXTRACTION Bilateral     OB History     Gravida  5   Para  2   Term  2   Preterm      AB  3   Living  2      SAB  1   IAB      Ectopic      Multiple      Live Births               Home Medications    Prior to  Admission medications   Not on File    Family History Family History  Problem Relation Age of Onset   Asthma Maternal Grandmother    Hypertension Maternal Grandmother    Asthma Mother    Hypertension Mother    Asthma Brother    Asthma Daughter     Social History Social History   Tobacco Use   Smoking status: Former    Current packs/day: 0.25    Average packs/day: 0.3 packs/day for 1 year (0.3 ttl pk-yrs)    Types: Cigarettes   Smokeless tobacco: Never  Vaping Use   Vaping status: Never Used  Substance Use Topics   Alcohol use: Yes    Comment: occassional   Drug use: No     Allergies   Latex   Review of Systems Review of Systems Per HPI  Physical Exam Triage Vital Signs ED Triage Vitals  Encounter Vitals Group     BP 06/29/23 1503 112/81     Systolic BP Percentile --      Diastolic BP Percentile --      Pulse Rate 06/29/23 1503 83  Resp 06/29/23 1503 20     Temp 06/29/23 1503 98.2 F (36.8 C)     Temp Source 06/29/23 1503 Oral     SpO2 06/29/23 1503 96 %     Weight --      Height --      Head Circumference --      Peak Flow --      Pain Score 06/29/23 1502 6     Pain Loc --      Pain Education --      Exclude from Growth Chart --    No data found.  Updated Vital Signs BP 112/81 (BP Location: Right Arm)   Pulse 83   Temp 98.2 F (36.8 C) (Oral)   Resp 20   LMP 07/23/2017   SpO2 96%   Visual Acuity Right Eye Distance:   Left Eye Distance:   Bilateral Distance:    Right Eye Near:   Left Eye Near:    Bilateral Near:     Physical Exam Vitals and nursing note reviewed.  Constitutional:      General: She is not in acute distress.    Appearance: Normal appearance. She is not toxic-appearing.  HENT:     Head: Normocephalic and atraumatic.     Mouth/Throat:     Mouth: Mucous membranes are moist.     Pharynx: Oropharynx is clear.  Eyes:     General: No scleral icterus.    Extraocular Movements: Extraocular movements intact.   Cardiovascular:     Rate and Rhythm: Normal rate and regular rhythm.  Pulmonary:     Effort: Pulmonary effort is normal. No respiratory distress.     Breath sounds: Normal breath sounds. No wheezing, rhonchi or rales.  Abdominal:     General: Abdomen is flat. Bowel sounds are normal. There is no distension.     Palpations: Abdomen is soft.     Tenderness: There is abdominal tenderness in the right upper quadrant. There is no right CVA tenderness, left CVA tenderness, guarding or rebound. Negative signs include Murphy's sign and McBurney's sign.  Musculoskeletal:     Cervical back: Normal range of motion.  Lymphadenopathy:     Cervical: No cervical adenopathy.  Skin:    General: Skin is warm and dry.     Capillary Refill: Capillary refill takes less than 2 seconds.     Coloration: Skin is not jaundiced or pale.     Findings: No erythema.  Neurological:     Mental Status: She is alert and oriented to person, place, and time.  Psychiatric:        Mood and Affect: Mood is not anxious.        Behavior: Behavior is cooperative.      UC Treatments / Results  Labs (all labs ordered are listed, but only abnormal results are displayed) Labs Reviewed  CBC WITH DIFFERENTIAL/PLATELET  COMPREHENSIVE METABOLIC PANEL  LIPASE  POCT URINALYSIS DIP (MANUAL ENTRY)    EKG   Radiology No results found.  Procedures Procedures (including critical care time)  Medications Ordered in UC Medications - No data to display  Initial Impression / Assessment and Plan / UC Course  I have reviewed the triage vital signs and the nursing notes.  Pertinent labs & imaging results that were available during my care of the patient were reviewed by me and considered in my medical decision making (see chart for details).   Patient is well-appearing, normotensive, afebrile, not tachycardic, not tachypneic, oxygenating well on  room air.    1. RUQ pain Vitals and examination are reassuring today UA  today is negative for signs of infection or abnormality Given pain worse after eating, CMP, lipase, and CBC obtained Recommended low fat diet, increasing hydration with water, Tylenol for pain Strict ER precautions discussed with patient  The patient was given the opportunity to ask questions.  All questions answered to their satisfaction.  The patient is in agreement to this plan.    Final Clinical Impressions(s) / UC Diagnoses   Final diagnoses:  RUQ pain     Discharge Instructions      Urine sample you give [place] today looks great-no signs of infection.  As we discussed, the pain you are having is consistent with gallbladder inflammation or gallstones.  Recommend low-fat diet, pushing hydration plenty of fluids and electrolytes, and Tylenol 500 to 1000 mg every 6 hours as needed for pain.  We have checked blood work today and will contact you tomorrow if the results are abnormal.  Recommend follow-up with your primary care provider to further evaluate and manage your symptoms if they persist over the next couple of days.  If symptoms worsen and you develop severe pain, nausea/vomiting and are unable to keep fluids down, please seek care in the emergency room.     ED Prescriptions   None    PDMP not reviewed this encounter.   Valentino Nose, NP 06/29/23 [phone]

## 2023-06-29 NOTE — ED Triage Notes (Signed)
Pt reports RUQ, dark urine, diarrhea abdominal pain for last several days. Reports pain is worse with movements. Denies urinary frequency, nausea, vomiting or fevers.

## 2023-06-30 LAB — CBC WITH DIFFERENTIAL/PLATELET
Basophils Absolute: 0 10*3/uL (ref 0.0–0.2)
Basos: 1 %
EOS (ABSOLUTE): 0.1 10*3/uL (ref 0.0–0.4)
Eos: 1 %
Hematocrit: 38.1 % (ref 34.0–46.6)
Hemoglobin: 12.7 g/dL (ref 11.1–15.9)
Immature Grans (Abs): 0 10*3/uL (ref 0.0–0.1)
Immature Granulocytes: 0 %
Lymphocytes Absolute: 1.7 10*3/uL (ref 0.7–3.1)
Lymphs: 29 %
MCH: 35.3 pg — ABNORMAL HIGH (ref 26.6–33.0)
MCHC: 33.3 g/dL (ref 31.5–35.7)
MCV: 106 fL — ABNORMAL HIGH (ref 79–97)
Monocytes Absolute: 0.4 10*3/uL (ref 0.1–0.9)
Monocytes: 6 %
Neutrophils Absolute: 3.7 10*3/uL (ref 1.4–7.0)
Neutrophils: 63 %
Platelets: 330 10*3/uL (ref 150–450)
RBC: 3.6 x10E6/uL — ABNORMAL LOW (ref 3.77–5.28)
RDW: 11.4 % — ABNORMAL LOW (ref 11.7–15.4)
WBC: 6 10*3/uL (ref 3.4–10.8)

## 2023-06-30 LAB — COMPREHENSIVE METABOLIC PANEL
ALT: 21 [IU]/L (ref 0–32)
AST: 18 [IU]/L (ref 0–40)
Albumin: 4.3 g/dL (ref 3.9–4.9)
Alkaline Phosphatase: 80 [IU]/L (ref 44–121)
BUN/Creatinine Ratio: 9 (ref 9–23)
BUN: 6 mg/dL (ref 6–20)
Bilirubin Total: 0.5 mg/dL (ref 0.0–1.2)
CO2: 20 mmol/L (ref 20–29)
Calcium: 9.4 mg/dL (ref 8.7–10.2)
Chloride: 104 mmol/L (ref 96–106)
Creatinine, Ser: 0.65 mg/dL (ref 0.57–1.00)
Globulin, Total: 2.6 g/dL (ref 1.5–4.5)
Glucose: 78 mg/dL (ref 70–99)
Potassium: 4.3 mmol/L (ref 3.5–5.2)
Sodium: 138 mmol/L (ref 134–144)
Total Protein: 6.9 g/dL (ref 6.0–8.5)
eGFR: 115 mL/min/{1.73_m2} (ref >59)

## 2023-06-30 LAB — LIPASE: Lipase: 25 U/L (ref 14–72)

## 2023-07-03 ENCOUNTER — Telehealth: Payer: Self-pay | Admitting: Physician Assistant

## 2023-07-03 NOTE — Telephone Encounter (Signed)
Pt has been contacted with results obtained on 06/29/2023. Pt verbalized understanding.

## 2023-07-03 NOTE — Telephone Encounter (Signed)
Patient called with questions regarding her lab work that was obtained at her visit on 06/29/2023.  Please call her with the following information.  Her lipase was normal indicating that there was no evidence of inflammation of her pancreas.  Her metabolic panel was normal with normal liver/kidney function and electrolytes.  Her blood counts were stable.  She was not anemic and her white blood cell count was normal.  She did have slightly big red blood cells but this appears to be chronic going back at least 5 years and something that she can follow-up with her primary care regarding.    If she has any worsening or changing symptoms including severe abdominal pain, fever, nausea/vomiting, change in the color of her eyes or skin, confusion, lightheadedness or feeling like she is going to pass out she should go to the emergency room.

## 2023-08-07 ENCOUNTER — Ambulatory Visit (HOSPITAL_COMMUNITY)
Admission: RE | Admit: 2023-08-07 | Discharge: 2023-08-07 | Disposition: A | Discharge status: Home / Self Care | Payer: Medicaid Other | Source: Ambulatory Visit | Attending: Family Medicine | Admitting: Family Medicine

## 2023-08-07 DIAGNOSIS — Z1231 Encounter for screening mammogram for malignant neoplasm of breast: Secondary | ICD-10-CM | POA: Diagnosis present

## 2023-11-09 ENCOUNTER — Telehealth: Admitting: Family Medicine

## 2023-11-09 DIAGNOSIS — N760 Acute vaginitis: Secondary | ICD-10-CM

## 2023-11-09 DIAGNOSIS — B9689 Other specified bacterial agents as the cause of diseases classified elsewhere: Secondary | ICD-10-CM | POA: Diagnosis not present

## 2023-11-09 DIAGNOSIS — H579 Unspecified disorder of eye and adnexa: Secondary | ICD-10-CM | POA: Diagnosis not present

## 2023-11-09 MED ORDER — FLUCONAZOLE 150 MG PO TABS: 150.0000 mg | ORAL_TABLET | ORAL | 0 refills | Status: DC

## 2023-11-09 MED ORDER — OLOPATADINE HCL 0.2 % OP SOLN: 1.0000 [drp] | Freq: Every day | OPHTHALMIC | 0 refills | Status: DC

## 2023-11-09 MED ORDER — METRONIDAZOLE 500 MG PO TABS: 500.0000 mg | ORAL_TABLET | Freq: Two times a day (BID) | ORAL | 0 refills | Status: AC

## 2023-11-09 NOTE — Patient Instructions (Addendum)
 Ritta L Bracken, thank you for joining Lanetta Pion, NP for today's virtual visit.  While this provider is not your primary care provider (PCP), if your PCP is located in our provider database this encounter information will be shared with them immediately following your visit.   A [facility] MyChart account gives you access to today's visit and all your visits, tests, and labs performed at [facility]  click here if you don't have a [facility] MyChart account or go to [email or web address]  Consent: (Patient) Lisa Hanna provided verbal consent for this virtual visit at the beginning of the encounter.  Current Medications:  Current Outpatient Medications:    fluconazole  (DIFLUCAN ) 150 MG tablet, Take 1 tablet (150 mg total) by mouth as directed. Repeat in 3 days as needed, Disp: 2 tablet, Rfl: 0   metroNIDAZOLE  (FLAGYL ) 500 MG tablet, Take 1 tablet (500 mg total) by mouth 2 (two) times daily for 7 days., Disp: 14 tablet, Rfl: 0   Olopatadine  HCl 0.2 % SOLN, Apply 1 drop to eye daily., Disp: 2.5 mL, Rfl: 0   Medications ordered in this encounter:  Meds ordered this encounter  Medications   Olopatadine  HCl 0.2 % SOLN    Sig: Apply 1 drop to eye daily.    Dispense:  2.5 mL    Refill:  0    Supervising Provider:   Corine Dice [id number]   metroNIDAZOLE  (FLAGYL ) 500 MG tablet    Sig: Take 1 tablet (500 mg total) by mouth 2 (two) times daily for 7 days.    Dispense:  14 tablet    Refill:  0    Supervising Provider:   Corine Dice [[id number]]   fluconazole  (DIFLUCAN ) 150 MG tablet    Sig: Take 1 tablet (150 mg total) by mouth as directed. Repeat in 3 days as needed    Dispense:  2 tablet    Refill:  0    Supervising Provider:   LAMPTEY, PHILIP O [[id number]]     *If you need refills on other medications prior to your next appointment, please contact your pharmacy*  Follow-Up: Call back or seek an in-person evaluation if the symptoms worsen or  if the condition fails to improve as anticipated.  [facility] [phone]  Other Instructions  Healthy vaginal hygiene practices   -  Avoid sleeper pajamas. Nightgowns allow air to circulate.  Sleep without underpants whenever possible.  -  Wear cotton underpants during the day. Double-rinse underwear after washing to avoid residual irritants. Do not use fabric softeners for underwear and swimsuits.  - Avoid tights, leotards, leggings, skinny jeans, and other tight-fitting clothing. Skirts and loose-fitting pants allow air to circulate.  - Avoid pantyliners.  Instead use tampons or cotton pads.  - Use the restroom after intercourse to help prevent UTI's  - Daily warm bathing is helpful:     - Soak in clean water (no soap) for 10 to 15 minutes. Adding vinegar or baking soda to the water has not been specifically studied and may not be better than clean water alone.      - Use soap to wash regions other than the genital area just before getting out of the tub. Limit use of any soap on genital areas. Use fragance-free soaps.     - Rinse the genital area well and gently pat dry.  Don't rub.  Hair dryer to assist with drying can be used only if on cool setting.     -  Do not use bubble baths or perfumed soaps.  - Do not use any feminine sprays, douches or powders.  These contain chemicals that will irritate the skin.  - If the genital area is tender or swollen, cool compresses may relieve the discomfort. Unscented wet wipes can be used instead of toilet paper for wiping.   - Emollients, such as Vaseline, may help protect skin and can be applied to the irritated area.  - Always remember to wipe front-to-back after bowel movements. Pat dry after urination.  - Do not sit in wet swimsuits for long periods of time after swimming    If you have been instructed to have an in-person evaluation today at a local Urgent Care facility, please use the link below. It will take you  to a list of all of our available [facility], including address, phone number and hours of operation. Please do not delay care.  [facility]  If you or a family member do not have a primary care provider, use the link below to schedule a visit and establish care. When you choose a [facility] primary care physician or advanced practice provider, you gain a long-term partner in health. Find a Primary Care Provider  Learn more about [facility]'s in-office and virtual care options: [facility] - Get Care Now

## 2023-11-09 NOTE — Progress Notes (Signed)
 Virtual Visit Consent   Lisa Hanna, you are scheduled for a virtual visit with a [facility] provider today. Just as with appointments in the office, your consent must be obtained to participate. Your consent will be active for this visit and any virtual visit you may have with one of our providers in the next 365 days. If you have a MyChart account, a copy of this consent can be sent to you electronically.  As this is a virtual visit, video technology does not allow for your provider to perform a traditional examination. This may limit your provider's ability to fully assess your condition. If your provider identifies any concerns that need to be evaluated in person or the need to arrange testing (such as labs, EKG, etc.), we will make arrangements to do so. Although advances in technology are sophisticated, we cannot ensure that it will always work on either your end or our end. If the connection with a video visit is poor, the visit may have to be switched to a telephone visit. With either a video or telephone visit, we are not always able to ensure that we have a secure connection.  By engaging in this virtual visit, you consent to the provision of healthcare and authorize for your insurance to be billed (if applicable) for the services provided during this visit. Depending on your insurance coverage, you may receive a charge related to this service.  I need to obtain your verbal consent now. Are you willing to proceed with your visit today? Lisa Hanna has provided verbal consent on 11/09/2023 for a virtual visit (video or telephone). Lanetta Pion, NP  Date: 11/09/2023 11:52 AM   Virtual Visit via Video Note   I, Lanetta Pion, connected with  Lisa Hanna  ([id number], Sep 14, 1985) on 11/09/23 at 11:45 AM EDT by a video-enabled telemedicine application and verified that I am speaking with the correct person using two identifiers.  Location: Patient: Virtual Visit  Location Patient: Home Provider: Virtual Visit Location Provider: Home Office   I discussed the limitations of evaluation and management by telemedicine and the availability of in person appointments. The patient expressed understanding and agreed to proceed.    History of Present Illness: Lisa Hanna is a 38 y.o. who identifies as a female who was assigned female at birth, and is being seen today for allergies and vaginal odor.  Onset was eye itching and watery-  Associated symptoms are none Modifying factors are none Denies chest pain, shortness of breath, fevers, chills, URI symptoms Does have allergies  Vaginal Odor- mild white discharge. No itching, burning or UTI symptoms, pelvic pain or back pain, fevers or chills.No new partners. Reports changing soap.   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   LSIL (low grade squamous intraepithelial lesion) on Pap smear 11/27/2010   Status post LEEP (loop electrosurgical excision procedure) of cervix 11/27/2010    Allergies:  Allergies  Allergen Reactions   Latex Dermatitis   Medications: No current outpatient medications on file.  Observations/Objective: Patient is well-developed, well-nourished in no acute distress.  Resting comfortably  at home.  Head is normocephalic, atraumatic.  No labored breathing.  Speech is clear and coherent with logical content.  Patient is alert and oriented at baseline.  Assessment and Plan:  1. Itchy eyes (Primary)  - Olopatadine  HCl 0.2 % SOLN; Apply 1 drop to eye daily.  Dispense: 2.5 mL; Refill: 0  2. BV (bacterial vaginosis)  - metroNIDAZOLE  (FLAGYL ) 500 MG tablet; Take 1 tablet (500 mg total) by mouth 2 (two) times daily for 7 days.  Dispense: 14 tablet;  Refill: 0 - fluconazole  (DIFLUCAN ) 150 MG tablet; Take 1 tablet (150 mg total) by mouth as directed. Repeat in 3 days as needed  Dispense: 2 tablet; Refill: 0   -allergies for eyes- OTC discussed as well -BV treatment- follow up if not improved   Reviewed side effects, risks and benefits of medication.    Patient acknowledged agreement and understanding of the plan.   Past Medical, Surgical, Social History, Allergies, and Medications have been Reviewed.     Follow Up Instructions: I discussed the assessment and treatment plan with the patient. The patient was provided an opportunity to ask questions and all were answered. The patient agreed with the plan and demonstrated an understanding of the instructions.  A copy of instructions were sent to the patient via MyChart unless otherwise noted below.    The patient was advised to call back or seek an in-person evaluation if the symptoms worsen or if the condition fails to improve as anticipated.    Lanetta Pion, NP

## 2024-01-14 ENCOUNTER — Other Ambulatory Visit: Payer: Self-pay

## 2024-01-14 ENCOUNTER — Emergency Department (HOSPITAL_COMMUNITY)
Admission: EM | Admit: 2024-01-14 | Discharge: 2024-01-15 | Disposition: A | Discharge status: Home / Self Care | Attending: Emergency Medicine | Admitting: Emergency Medicine

## 2024-01-14 ENCOUNTER — Encounter (HOSPITAL_COMMUNITY): Payer: Self-pay | Admitting: Emergency Medicine

## 2024-01-14 DIAGNOSIS — J069 Acute upper respiratory infection, unspecified: Secondary | ICD-10-CM

## 2024-01-14 DIAGNOSIS — Z91040 Latex allergy status: Secondary | ICD-10-CM | POA: Diagnosis not present

## 2024-01-14 DIAGNOSIS — R059 Cough, unspecified: Secondary | ICD-10-CM | POA: Insufficient documentation

## 2024-01-14 LAB — RESP PANEL BY RT-PCR (RSV, FLU A&B, COVID)  RVPGX2
Influenza A by PCR: NEGATIVE
Influenza B by PCR: NEGATIVE
Resp Syncytial Virus by PCR: NEGATIVE
SARS Coronavirus 2 by RT PCR: NEGATIVE

## 2024-01-14 NOTE — ED Triage Notes (Signed)
 Pt to the ED with flu-like symptoms that began on Thursday. Pt completed a covid test and it was negative at that time.

## 2024-01-15 MED ORDER — DOXYCYCLINE HYCLATE 100 MG PO CAPS: 100.0000 mg | ORAL_CAPSULE | Freq: Two times a day (BID) | ORAL | 0 refills | Status: DC

## 2024-01-15 NOTE — ED Provider Notes (Signed)
  [name] DE[facility]MENT AT [facility] Provider Note   CSN: [id number] Arrival date & time: 01/14/24  2227     Patient presents with: No chief complaint on file.   Lisa Hanna is a 38 y.o. female.   Patient is a 38 year old female with no significant past medical history.  Patient presenting today with a 5-day history of chest congestion, cough productive of yellow sputum, and feeling generally unwell.  She denies any fevers or chills.  No chest pain or difficulty breathing.  She has been taking Alka-Seltzer cold medicine with some relief.       Prior to Admission medications   Medication Sig Start Date End Date Taking? Authorizing Provider  fluconazole  (DIFLUCAN ) 150 MG tablet Take 1 tablet (150 mg total) by mouth as directed. Repeat in 3 days as needed 11/09/23   Moishe Chiquita HERO, NP  Olopatadine  HCl 0.2 % SOLN Apply 1 drop to eye daily. 11/09/23   Moishe Chiquita HERO, NP    Allergies: Latex    Review of Systems  All other systems reviewed and are negative.   Updated Vital Signs BP 124/88 (BP Location: Right Arm)   Pulse 88   Temp 98.8 F (37.1 C) (Oral)   Resp 18   LMP 07/23/2017   SpO2 100%   Physical Exam Vitals and nursing note reviewed.  Constitutional:      General: She is not in acute distress.    Appearance: She is well-developed. She is not diaphoretic.  HENT:     Head: Normocephalic and atraumatic.  Cardiovascular:     Rate and Rhythm: Normal rate and regular rhythm.     Heart sounds: No murmur heard.    No friction rub. No gallop.  Pulmonary:     Effort: Pulmonary effort is normal. No respiratory distress.     Breath sounds: Normal breath sounds. No wheezing.  Abdominal:     General: Bowel sounds are normal. There is no distension.     Palpations: Abdomen is soft.     Tenderness: There is no abdominal tenderness.  Musculoskeletal:        General: Normal range of motion.     Cervical back: Normal range of motion and neck  supple.  Skin:    General: Skin is warm and dry.  Neurological:     General: No focal deficit present.     Mental Status: She is alert and oriented to person, place, and time.     (all labs ordered are listed, but only abnormal results are displayed) Labs Reviewed  RESP PANEL BY RT-PCR (RSV, FLU A&B, COVID)  [id number]    EKG: None  Radiology: No results found.   Procedures   Medications Ordered in the ED - No data to display                                  Medical Decision Making  Symptoms most likely viral in nature.  Her COVID/flu/RSV are all negative.  Patient to be discharged with over-the-counter medications.  I will also prescribe a prescription for doxycycline  she can take if not improving in the next 3 days.     Final diagnoses:  None    ED Discharge Orders     None          Geroldine Berg, MD 01/15/24 [phone]

## 2024-01-15 NOTE — Discharge Instructions (Signed)
 Continue over-the-counter medications as needed for relief of symptoms.  Drink plenty of fluids and get plenty of rest.  If not improving in the next 3 days, fill the prescription for doxycycline  you have been provided with this evening and begin taking it as prescribed.

## 2024-02-05 ENCOUNTER — Encounter: Payer: Self-pay | Admitting: Gastroenterology

## 2024-02-20 ENCOUNTER — Telehealth (INDEPENDENT_AMBULATORY_CARE_PROVIDER_SITE_OTHER): Payer: Self-pay

## 2024-02-20 ENCOUNTER — Ambulatory Visit (INDEPENDENT_AMBULATORY_CARE_PROVIDER_SITE_OTHER): Admitting: Gastroenterology

## 2024-02-20 ENCOUNTER — Encounter: Payer: Self-pay | Admitting: Gastroenterology

## 2024-02-20 VITALS — BP 102/63 | HR 86 | Temp 98.1°F | Ht 66.0 in | Wt 182.7 lb

## 2024-02-20 DIAGNOSIS — R1011 Right upper quadrant pain: Secondary | ICD-10-CM | POA: Diagnosis not present

## 2024-02-20 DIAGNOSIS — K5909 Other constipation: Secondary | ICD-10-CM

## 2024-02-20 MED ORDER — LINACLOTIDE 145 MCG PO CAPS: 145.0000 ug | ORAL_CAPSULE | Freq: Every day | ORAL | Status: AC

## 2024-02-20 NOTE — Telephone Encounter (Signed)
 ATC patient, no answer. Sending mychart message to inform patient of Ultrasound appointment.

## 2024-02-20 NOTE — Patient Instructions (Addendum)
 Please have breath test and blood work done fasting at [facility]. Make sure this is first thing in the morning, as they only do the breath test in the morning.  To help with bowel habits, I am starting you on Linzess  145 mcg.   Linzess  works best when taken once a day every day, on an empty stomach, at least 30 minutes before your first meal of the day.  When Linzess  is taken daily as directed:  *Constipation relief is typically felt in about a week *IBS-C patients may begin to experience relief from belly pain and overall abdominal symptoms (pain, discomfort, and bloating) in about 1 week,   with symptoms typically improving over 12 weeks.  Diarrhea may occur in the first 2 weeks -keep taking it.  The diarrhea should go away and you should start having normal, complete, full bowel movements. It may be helpful to start treatment when you can be near the comfort of your own bathroom, such as a weekend.   Let me know how this works for you! I can send in a prescription if you like this.  If all tests are negative and no improvement with bowel regimen, we will do an upper endoscopy.  We will see you in 2 months or sooner if needed!  It was a pleasure to see you today. I want to create trusting relationships with patients and provide genuine, compassionate, and quality care. I truly value your feedback, so please be on the lookout for a survey regarding your visit with me today. I appreciate your time in completing this!         Therisa MICAEL Stager, PhD, ANP-BC [facility]

## 2024-02-20 NOTE — Progress Notes (Signed)
 Gastroenterology Office Note    Referring Provider: Burnard Macario Rouleau, FNP Primary Care Physician:  Burnard Macario Rouleau, FNP Primary GI: Dr. Cindie   Chief Complaint   Chief Complaint  Patient presents with   Abdominal Pain    Pt arrives due to abdominal pain in upper left and right quadrants. Going on for about a month. Pt is having constipation. No meds at this time for abdominal pain.      History of Present Illness   Lisa Hanna is a 38 y.o. female presenting today at the request of Burnard Macario Rouleau, FNP, due to abdominal pain.    Outside Labs July 2025: Hgb 12.8, Hct 40.1, Platelets 332, Tbili 0.7, Alk Phos 89, AST 30, ALT 31, creatinine 0.76, BUN 8, albumin 4.5. TSH 1.58  Onset of pain in July. Referred here in July. Labs were done in July.  RUQ abdominal pain, burning, sharp pain, feels like pressure. No prior episodes. Intermittent. Eating worsens. Pain can last several hours. No relieving factors. Everything she eats causes discomfort. No nausea. No NSAIDs. +reflux, depending on food choices. Cabbage and onions. Pain not associated with reflux.   Notes constipation for the past week. Drank some apple juice. Baseline habits about every 3 days feels like having to strain. No rectal bleeding. No rectal pain. No changes in diet. No weight loss. Last BM yesterday. Having to strain more than normal. Has to lean over to push down for stool.  No FH colon cancer or polyps   Past Medical History:  Diagnosis Date   Miscarriage    Panic attack    Vaginal Pap smear, abnormal     Past Surgical History:  Procedure Laterality Date   HAND SURGERY     LEEP     VAGINAL HYSTERECTOMY N/A 08/02/2017   Procedure: HYSTERECTOMY VAGINAL;  Surgeon: Jayne Vonn DEL, MD;  Location: [facility];  Service: Gynecology;  Laterality: N/A;   WISDOM TOOTH EXTRACTION Bilateral     No current outpatient medications on file.   No current facility-administered medications for this visit.     Allergies as of 02/20/2024 - Review Complete 02/20/2024  Allergen Reaction Noted   Latex Dermatitis 09/02/2011    Family History  Problem Relation Age of Onset   Asthma Mother    Hypertension Mother    Asthma Brother    Asthma Maternal Grandmother    Hypertension Maternal Grandmother    Asthma Daughter    Colon cancer Neg Hx    Colon polyps Neg Hx     Social History   Socioeconomic History   Marital status: Married    Spouse name: Not on file   Number of children: 2   Years of education: Not on file   Highest education level: Not on file  Occupational History   Not on file  Tobacco Use   Smoking status: Some Days    Current packs/day: 0.25    Average packs/day: 0.3 packs/day for 1 year (0.3 ttl pk-yrs)    Types: Cigarettes   Smokeless tobacco: Never  Vaping Use   Vaping status: Never Used  Substance and Sexual Activity   Alcohol use: Yes    Comment: occassional   Drug use: No   Sexual activity: Not Currently    Birth control/protection: Surgical    Comment: hyst  Other Topics Concern   Not on file  Social History Narrative   Not on file   Social Drivers of Health   Financial Resource Strain:  Not on file  Food Insecurity: Not on file  Transportation Needs: Not on file  Physical Activity: Not on file  Stress: Not on file  Social Connections: Not on file  Intimate Partner Violence: Not on file     Review of Systems   Gen: Denies any fever, chills, fatigue, weight loss, lack of appetite.  CV: Denies chest pain, heart palpitations, peripheral edema, syncope.  Resp: Denies shortness of breath at rest or with exertion. Denies wheezing or cough.  GI: Denies dysphagia or odynophagia. Denies jaundice, hematemesis, fecal incontinence. GU : Denies urinary burning, urinary frequency, urinary hesitancy MS: Denies joint pain, muscle weakness, cramps, or limitation of movement.  Derm: Denies rash, itching, dry skin Psych: Denies depression, anxiety, memory  loss, and confusion Heme: Denies bruising, bleeding, and enlarged lymph nodes.   Physical Exam   BP 102/63   Pulse 86   Temp 98.1 F (36.7 C)   Ht 5' 6 (1.676 m)   Wt 182 lb 11.2 oz (82.9 kg)   LMP 07/23/2017   BMI 29.49 kg/m  General:   Alert and oriented. Pleasant and cooperative. Well-nourished and well-developed.  Head:  Normocephalic and atraumatic. Eyes:  Without icterus Ears:  Normal auditory acuity. Abdomen:  +BS, soft, mild TTP epigastric, moderate TTP RUQ and non-distended. No HSM noted. No guarding or rebound. No masses appreciated.  Rectal:  small external hemorrhoid tag, no mass on DRE Msk:  Symmetrical without gross deformities. Normal posture. Extremities:  Without edema. Neurologic:  Alert and  oriented x4;  grossly normal neurologically. Skin:  Intact without significant lesions or rashes. Psych:  Alert and cooperative. Normal mood and affect.   Assessment   Lisa Hanna is a 38 y.o. female presenting today  at the request of Burnard Macario Rouleau, FNP, due to abdominal pain.   Abdominal pain: noted as RUQ with onset in July. Concern for biliary etiology due to postprandial features. Rare reflux if eating certain foods and not linked with pain. LFTs in July normal. Does not appear consistent with pancreatitis. Will check urea breath test, CMP, RUQ US . She also notes worsening constipation, which could certainly be playing a role. If all negative work-up and no improvement with bowel regimen, will recommend EGD.  Constipation: chronic but now worsening. No mass on DRE. No alarm signs/symptoms. TSH recently normal. No anemia on July labs. Start Linzess  145 mcg daily. As noted, could be contributing to abdominal pain.      PLAN   CMP, urea breath test  RUQ US   Linzess  145 mcg daily  If labs and urea breath test negative, RUQ US  normal, and no improvement with bowel regimen, needs EGD  Return in 2 months regardless  Further recommendations to follow    Therisa MICAEL Stager, PhD, ANP-BC [facility]

## 2024-02-23 LAB — COMPREHENSIVE METABOLIC PANEL WITH GFR
ALT: 28 IU/L (ref 0–32)
AST: 19 IU/L (ref 0–40)
Albumin: 4.2 g/dL (ref 3.9–4.9)
Alkaline Phosphatase: 86 IU/L (ref 41–116)
BUN/Creatinine Ratio: 12 (ref 9–23)
BUN: 9 mg/dL (ref 6–24)
Bilirubin Total: 0.7 mg/dL (ref 0.0–1.2)
CO2: 22 mmol/L (ref 20–29)
Calcium: 8.9 mg/dL (ref 8.7–10.2)
Chloride: 104 mmol/L (ref 96–106)
Creatinine, Ser: 0.75 mg/dL (ref 0.57–1.00)
Globulin, Total: 2.2 g/dL (ref 1.5–4.5)
Glucose: 80 mg/dL (ref 70–99)
Potassium: 3.8 mmol/L (ref 3.5–5.2)
Sodium: 139 mmol/L (ref 134–144)
Total Protein: 6.4 g/dL (ref 6.0–8.5)
eGFR: 103 mL/min/1.73 (ref >59)

## 2024-02-23 LAB — H. PYLORI BREATH TEST

## 2024-02-28 ENCOUNTER — Ambulatory Visit (HOSPITAL_COMMUNITY)
Admission: RE | Admit: 2024-02-28 | Discharge: 2024-02-28 | Disposition: A | Discharge status: Home / Self Care | Source: Ambulatory Visit | Attending: Gastroenterology | Admitting: Gastroenterology

## 2024-02-28 DIAGNOSIS — R1011 Right upper quadrant pain: Secondary | ICD-10-CM | POA: Diagnosis present

## 2024-03-05 ENCOUNTER — Ambulatory Visit: Payer: Self-pay | Admitting: Gastroenterology

## 2024-03-09 MED ORDER — LINACLOTIDE 145 MCG PO CAPS: 145.0000 ug | ORAL_CAPSULE | Freq: Every day | ORAL | 3 refills | Status: AC

## 2024-03-09 NOTE — Addendum Note (Signed)
 Addended by: SHIRLEAN THERISA ORN on: 03/09/2024 12:03 PM   Modules accepted: Orders

## 2024-03-24 ENCOUNTER — Telehealth: Admitting: Physician Assistant

## 2024-03-24 DIAGNOSIS — N760 Acute vaginitis: Secondary | ICD-10-CM | POA: Diagnosis not present

## 2024-03-24 DIAGNOSIS — L309 Dermatitis, unspecified: Secondary | ICD-10-CM | POA: Diagnosis not present

## 2024-03-24 DIAGNOSIS — B9689 Other specified bacterial agents as the cause of diseases classified elsewhere: Secondary | ICD-10-CM | POA: Diagnosis not present

## 2024-03-24 MED ORDER — FLUCONAZOLE 150 MG PO TABS: 150.0000 mg | ORAL_TABLET | Freq: Once | ORAL | 0 refills | Status: AC

## 2024-03-24 MED ORDER — METRONIDAZOLE 0.75 % VA GEL: 1.0000 | Freq: Two times a day (BID) | VAGINAL | 0 refills | Status: AC

## 2024-03-24 MED ORDER — HYDROCORTISONE 1 % EX LOTN: 1.0000 | TOPICAL_LOTION | Freq: Two times a day (BID) | CUTANEOUS | 0 refills | Status: AC

## 2024-03-24 NOTE — Progress Notes (Signed)
 Virtual Visit Consent   [name], you are scheduled for a virtual visit with a [facility] provider today. Just as with appointments in the office, your consent must be obtained to participate. Your consent will be active for this visit and any virtual visit you may have with one of our providers in the next 365 days. If you have a MyChart account, a copy of this consent can be sent to you electronically.  As this is a virtual visit, video technology does not allow for your provider to perform a traditional examination. This may limit your provider's ability to fully assess your condition. If your provider identifies any concerns that need to be evaluated in person or the need to arrange testing (such as labs, EKG, etc.), we will make arrangements to do so. Although advances in technology are sophisticated, we cannot ensure that it will always work on either your end or our end. If the connection with a video visit is poor, the visit may have to be switched to a telephone visit. With either a video or telephone visit, we are not always able to ensure that we have a secure connection.  By engaging in this virtual visit, you consent to the provision of healthcare and authorize for your insurance to be billed (if applicable) for the services provided during this visit. Depending on your insurance coverage, you may receive a charge related to this service.  I need to obtain your verbal consent now. Are you willing to proceed with your visit today? Lisa Hanna has provided verbal consent on 03/24/2024 for a virtual visit (video or telephone). [name], [place]  Date: 03/24/2024 11:43 AM   Virtual Visit via Video Note   I, [name], connected with  Lisa Hanna  ([id number], 03-Jun-1985) on 03/24/24 at 11:45 AM EDT by a video-enabled telemedicine application and verified that I am speaking with the correct person using two identifiers.  Location: Patient: Virtual Visit  Location Patient: Home Provider: Virtual Visit Location Provider: Home Office   I discussed the limitations of evaluation and management by telemedicine and the availability of in person appointments. The patient expressed understanding and agreed to proceed.    History of Present Illness: Lisa Hanna is a 38 y.o. who identifies as a female who was assigned female at birth, and is being seen today for fishy odor after using a new soap.  HPI: Vaginal Itching The patient's primary symptoms include genital itching and a genital odor. This is a new problem. The current episode started yesterday. She is not pregnant. Pertinent negatives include no abdominal pain, anorexia, back pain, chills, constipation, diarrhea, discolored urine, dysuria, fever, flank pain, frequency, headaches, hematuria, joint pain, joint swelling, nausea, painful intercourse, rash, sore throat, urgency or vomiting. Nothing aggravates the symptoms. She has tried nothing for the symptoms. The treatment provided no relief. She is not sexually active. No, her partner does not have an STD.    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   LSIL (low grade squamous intraepithelial lesion) on Pap smear 11/27/2010   Status post LEEP (loop electrosurgical excision procedure) of cervix 11/27/2010    Allergies:  Allergies  Allergen Reactions   Latex Dermatitis   Medications:  Current Outpatient Medications:    linaclotide  (LINZESS ) 145  MCG CAPS capsule, Take 1 capsule (145 mcg total) by mouth daily before breakfast., Disp: , Rfl:    linaclotide  (LINZESS ) 145 MCG CAPS capsule, Take 1 capsule (145 mcg total) by mouth daily before breakfast., Disp: 90 capsule, Rfl: 3  Observations/Objective: Patient is  well-developed, well-nourished in no acute distress.  Resting comfortably  at home.  Head is normocephalic, atraumatic.  No labored breathing.  Speech is clear and coherent with logical content.  Patient is alert and oriented at baseline.    Assessment and Plan: 1. BV (bacterial vaginosis) (Primary)  2. Dermatitis  Patient presenting with vaginal irritation most likely BV after using a new soap.   I also considered PID, pregnancy, ectopic pregnancy, UTI. endometriosis,  tubovarian abscess, appendicitis, yeast vaginitis,  and pyelonephritis, but this appears less likely considering the data gathered thus far.  Medication prescribed. I have instructed the patient to present to the nearest ER at any time if there are any new or worsening symptoms.  The patient expressed understanding of and agreement with this plan.  Opportunity was given for questions prior to discharge and all stated questions were answered to the patient's satisfaction.  Also provided topical hydrocortisone for patients contact dermatitis.   Follow Up Instructions: I discussed the assessment and treatment plan with the patient. The patient was provided an opportunity to ask questions and all were answered. The patient agreed with the plan and demonstrated an understanding of the instructions.  A copy of instructions were sent to the patient via MyChart unless otherwise noted below.    The patient was advised to call back or seek an in-person evaluation if the symptoms worsen or if the condition fails to improve as anticipated.    [name], PA-C

## 2024-03-24 NOTE — Patient Instructions (Signed)
  Lisa Hanna, thank you for joining Lisa Shuck, PA-C for today's virtual visit.  While this provider is not your primary care provider (PCP), if your PCP is located in our provider database this encounter information will be shared with them immediately following your visit.   A [facility] MyChart account gives you access to today's visit and all your visits, tests, and labs performed at [facility]  click here if you don't have a [facility] MyChart account or go to [email or web address]  Consent: (Patient) Lisa Hanna provided verbal consent for this virtual visit at the beginning of the encounter.  Current Medications:  Current Outpatient Medications:    linaclotide  (LINZESS ) 145 MCG CAPS capsule, Take 1 capsule (145 mcg total) by mouth daily before breakfast., Disp: , Rfl:    linaclotide  (LINZESS ) 145 MCG CAPS capsule, Take 1 capsule (145 mcg total) by mouth daily before breakfast., Disp: 90 capsule, Rfl: 3   Medications ordered in this encounter:  No orders of the defined types were placed in this encounter.    *If you need refills on other medications prior to your next appointment, please contact your pharmacy*  Follow-Up: Call back or seek an in-person evaluation if the symptoms worsen or if the condition fails to improve as anticipated.  [facility] [phone]  Other Instructions Please report to the nearest Emergency room with any worsening symptoms. Follow up with primary care provider (PCP) in 2 -3 days.    If you have been instructed to have an in-person evaluation today at a local Urgent Care facility, please use the link below. It will take you to a list of all of our available [facility], including address, phone number and hours of operation. Please do not delay care.  [facility]  If you or a family member do not have a primary care provider, use the link below to schedule a visit and  establish care. When you choose a [facility] primary care physician or advanced practice provider, you gain a long-term partner in health. Find a Primary Care Provider  Learn more about [facility] in-office and virtual care options: [facility] - Get Care Now

## 2024-04-17 ENCOUNTER — Emergency Department (HOSPITAL_COMMUNITY)

## 2024-04-17 ENCOUNTER — Other Ambulatory Visit: Payer: Self-pay

## 2024-04-17 ENCOUNTER — Encounter (HOSPITAL_COMMUNITY): Payer: Self-pay | Admitting: Emergency Medicine

## 2024-04-17 ENCOUNTER — Emergency Department (HOSPITAL_COMMUNITY)
Admission: EM | Admit: 2024-04-17 | Discharge: 2024-04-17 | Disposition: A | Discharge status: Home / Self Care | Attending: Emergency Medicine | Admitting: Emergency Medicine

## 2024-04-17 DIAGNOSIS — R0602 Shortness of breath: Secondary | ICD-10-CM | POA: Diagnosis present

## 2024-04-17 DIAGNOSIS — Z91040 Latex allergy status: Secondary | ICD-10-CM | POA: Insufficient documentation

## 2024-04-17 DIAGNOSIS — R0600 Dyspnea, unspecified: Secondary | ICD-10-CM

## 2024-04-17 NOTE — ED Triage Notes (Signed)
 Pt arrived to ED c/o feeling like she couldn't catch her breath when she first woke up. Pt states she doesn't really feel short of breath anymore. States it may have just been a panic attack but wanted to come make sure.

## 2024-04-17 NOTE — Discharge Instructions (Signed)
 Rest.  Return to the emergency department if you develop any new and/or concerning issues.

## 2024-04-17 NOTE — ED Provider Notes (Signed)
 [place] [facility] AT [facility] Provider Note   CSN: [id number] Arrival date & time: 04/17/24  2229     Patient presents with: Shortness of Breath   Lisa Hanna is a 38 y.o. female.   Patient is a 38 year old female with past medical history of panic attacks.  Patient presenting today with complaints of shortness of breath.  She went to bed this evening feeling well, then woke up feeling as if she could not breathe.  This lasted for several minutes, then seemed to resolve.  No chest pain, cough, or fever.       Prior to Admission medications   Medication Sig Start Date End Date Taking? Authorizing Provider  linaclotide  (LINZESS ) 145 MCG CAPS capsule Take 1 capsule (145 mcg total) by mouth daily before breakfast. 02/20/24   Shirlean Therisa ORN, NP  linaclotide  (LINZESS ) 145 MCG CAPS capsule Take 1 capsule (145 mcg total) by mouth daily before breakfast. 03/09/24   Shirlean Therisa ORN, NP  metroNIDAZOLE  (METROGEL ) 0.75 % vaginal gel Place 1 Applicatorful vaginally 2 (two) times daily. 03/24/24   Rolan Berthold, PA-C    Allergies: Latex    Review of Systems  All other systems reviewed and are negative.   Updated Vital Signs BP 120/82   Pulse 88   Temp 98.6 F (37 C) (Oral)   Resp 17   Ht 5' 6 (1.676 m)   Wt 82.6 kg   LMP 07/23/2017   SpO2 97%   BMI 29.38 kg/m   Physical Exam Vitals and nursing note reviewed.  Constitutional:      General: She is not in acute distress.    Appearance: She is well-developed. She is not diaphoretic.  HENT:     Head: Normocephalic and atraumatic.  Cardiovascular:     Rate and Rhythm: Normal rate and regular rhythm.     Heart sounds: No murmur heard.    No friction rub. No gallop.  Pulmonary:     Effort: Pulmonary effort is normal. No respiratory distress.     Breath sounds: Normal breath sounds. No wheezing.  Abdominal:     General: Bowel sounds are normal. There is no distension.     Palpations: Abdomen is  soft.     Tenderness: There is no abdominal tenderness.  Musculoskeletal:        General: Normal range of motion.     Cervical back: Normal range of motion and neck supple.  Skin:    General: Skin is warm and dry.  Neurological:     General: No focal deficit present.     Mental Status: She is alert and oriented to person, place, and time.     (all labs ordered are listed, but only abnormal results are displayed) Labs Reviewed - No data to display  EKG: None  Radiology: DG Chest 2 View Result Date: 04/17/2024 EXAM: 2 VIEW(S) XRAY OF THE CHEST 04/17/2024 10:56:00 PM COMPARISON: Chest x-ray 06/01/2022. CLINICAL HISTORY: SOB. FINDINGS: LUNGS AND PLEURA: No focal pulmonary opacity. No pleural effusion. No pneumothorax. HEART AND MEDIASTINUM: No acute abnormality of the cardiac and mediastinal silhouettes. BONES AND SOFT TISSUES: No acute osseous abnormality. IMPRESSION: 1. No acute cardiopulmonary process. Electronically signed by: Morgane Naveau MD 04/17/2024 11:12 PM EST RP Workstation: [id number]     Procedures   Medications Ordered in the ED - No data to display  Medical Decision Making  Patient presenting with shortness of breath she suspects may be related to a panic attack that began in her sleep.  She arrives here with stable vital signs and symptoms that have resolved.  Her oxygen saturations are 98% on room air, lungs are clear, and she appears clinically well.  I do suspect a panic attack and will have patient return as needed for any problems.     Final diagnoses:  None    ED Discharge Orders     None          Geroldine Berg, MD 04/17/24 2339

## 2024-05-18 ENCOUNTER — Telehealth: Admitting: Family Medicine

## 2024-05-18 DIAGNOSIS — H1033 Unspecified acute conjunctivitis, bilateral: Secondary | ICD-10-CM

## 2024-05-18 MED ORDER — POLYMYXIN B-TRIMETHOPRIM 10000-0.1 UNIT/ML-% OP SOLN: 1.0000 [drp] | Freq: Four times a day (QID) | OPHTHALMIC | 0 refills | Status: AC

## 2024-05-18 NOTE — Progress Notes (Signed)
 " Virtual Visit Consent   Lisa Hanna, you are scheduled for a virtual visit with a [facility] provider today. Just as with appointments in the office, your consent must be obtained to participate. Your consent will be active for this visit and any virtual visit you may have with one of our providers in the next 365 days. If you have a MyChart account, a copy of this consent can be sent to you electronically.  As this is a virtual visit, video technology does not allow for your provider to perform a traditional examination. This may limit your provider's ability to fully assess your condition. If your provider identifies any concerns that need to be evaluated in person or the need to arrange testing (such as labs, EKG, etc.), we will make arrangements to do so. Although advances in technology are sophisticated, we cannot ensure that it will always work on either your end or our end. If the connection with a video visit is poor, the visit may have to be switched to a telephone visit. With either a video or telephone visit, we are not always able to ensure that we have a secure connection.  By engaging in this virtual visit, you consent to the provision of healthcare and authorize for your insurance to be billed (if applicable) for the services provided during this visit. Depending on your insurance coverage, you may receive a charge related to this service.  I need to obtain your verbal consent now. Are you willing to proceed with your visit today? Thy L Lwin has provided verbal consent on 05/18/2024 for a virtual visit (video or telephone). Loa Lamp, FNP  Date: 05/18/2024 12:30 PM   Virtual Visit via Video Note   I, Loa Lamp, connected with  Lisa Hanna  ([id number], 13-Jun-1985) on 05/18/2024 at 12:30 PM EST by a video-enabled telemedicine application and verified that I am speaking with the correct person using two identifiers.  Location: Patient: Virtual Visit  Location Patient: Home Provider: Virtual Visit Location Provider: Home Office   I discussed the limitations of evaluation and management by telemedicine and the availability of in person appointments. The patient expressed understanding and agreed to proceed.    History of Present Illness: Lisa Hanna is a 38 y.o. who identifies as a female who was assigned female at birth, and is being seen today for redness, irritation and matting of both eyes for 2 days. She works in holiday representative with allergy problems. SABRA  HPI: HPI  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   LSIL (low grade squamous intraepithelial lesion) on Pap smear 11/27/2010   Status post LEEP (loop electrosurgical excision procedure) of cervix 11/27/2010    Allergies: Allergies[1] Medications: Current Medications[2]  Observations/Objective: Patient is well-developed, well-nourished in no acute distress.  Resting comfortably  at home.  Head is normocephalic, atraumatic.  No labored breathing.  Speech is clear and coherent with logical content.  Patient is alert and oriented at baseline.    Assessment and Plan: 1. Acute bacterial conjunctivitis of both eyes (Primary) Cool compresses, start allegra and allergy eye drops. UC if sx persist or worsen.   Follow Up Instructions: I discussed the assessment and treatment plan with the patient. The patient was provided an opportunity to ask questions and all were  answered. The patient agreed with the plan and demonstrated an understanding of the instructions.  A copy of instructions were sent to the patient via MyChart unless otherwise noted below.     The patient was advised to call back or seek an in-person evaluation if the symptoms worsen or if the  condition fails to improve as anticipated.    Neil Errickson, FNP     [1]  Allergies Allergen Reactions   Latex Dermatitis  [2]  Current Outpatient Medications:    linaclotide  (LINZESS ) 145 MCG CAPS capsule, Take 1 capsule (145 mcg total) by mouth daily before breakfast., Disp: , Rfl:    linaclotide  (LINZESS ) 145 MCG CAPS capsule, Take 1 capsule (145 mcg total) by mouth daily before breakfast., Disp: 90 capsule, Rfl: 3   metroNIDAZOLE  (METROGEL ) 0.75 % vaginal gel, Place 1 Applicatorful vaginally 2 (two) times daily., Disp: 70 g, Rfl: 0  "

## 2024-05-18 NOTE — Patient Instructions (Signed)
 Bacterial Conjunctivitis, Adult Bacterial conjunctivitis is an infection of the clear membrane that covers the white part of the eye and the inner surface of the eyelid (conjunctiva). When the blood vessels in the conjunctiva become inflamed, the eye becomes red or pink. The eye often feels irritated or itchy. Bacterial conjunctivitis spreads easily from person to person (is contagious). It also spreads easily from one eye to the other eye. What are the causes? This condition is caused by bacteria. You may get the infection if you come into close contact with: A person who is infected with the bacteria. Items that are contaminated with the bacteria, such as a face towel, contact lens solution, or eye makeup. What increases the risk? You are more likely to develop this condition if: You are exposed to other people who have the infection. You wear contact lenses. You have a sinus infection. You have had a recent eye injury or surgery. You have a weak body defense system (immune system). You have a medical condition that causes dry eyes. What are the signs or symptoms? Symptoms of this condition include: Thick, yellowish discharge from the eye. This may turn into a crust on the eyelid overnight and cause your eyelids to stick together. Tearing or watery eyes. Itchy eyes. Burning feeling in your eyes. Eye redness. Swollen eyelids. Blurred vision. How is this diagnosed? This condition is diagnosed based on your symptoms and medical history. Your health care provider may also take a sample of discharge from your eye to find the cause of your infection. How is this treated? This condition may be treated with: Antibiotic eye drops or ointment to clear the infection more quickly and prevent the spread of infection to others. Antibiotic medicines taken by mouth (orally) to treat infections that do not respond to drops or ointments or that last longer than 10 days. Cool, wet cloths (cool  compresses) placed on the eyes. Artificial tears applied 2-6 times a day. Follow these instructions at home: Medicines Take or apply your antibiotic medicine as told by your health care provider. Do not stop using the antibiotic, even if your condition improves, unless directed by your health care provider. Take or apply over-the-counter and prescription medicines only as told by your health care provider. Be very careful to avoid touching the edge of your eyelid with the eye-drop bottle or the ointment tube when you apply medicines to the affected eye. This will keep you from spreading the infection to your other eye or to other people. Managing discomfort Gently wipe away any drainage from your eye with a warm, wet washcloth or a cotton ball. Apply a clean, cool compress to your eye for 10-20 minutes, 3-4 times a day. General instructions Do not wear contact lenses until the inflammation is gone and your health care provider says it is safe to wear them again. Ask your health care provider how to sterilize or replace your contact lenses before you use them again. Wear glasses until you can resume wearing contact lenses. Avoid wearing eye makeup until the inflammation is gone. Throw away any old eye cosmetics that may be contaminated. Change or wash your pillowcase every day. Do not share towels or washcloths. This may spread the infection. Wash your hands often with soap and water for at least 20 seconds and especially before touching your face or eyes. Use paper towels to dry your hands. Avoid touching or rubbing your eyes. Do not drive or use heavy machinery if your vision is blurred. Contact  a health care provider if: You have a fever. Your symptoms do not get better after 10 days. Get help right away if: You have a fever and your symptoms suddenly get worse. You have severe pain when you move your eye. You have facial pain, redness, or swelling. You have a sudden loss of  vision. Summary Bacterial conjunctivitis is an infection of the clear membrane that covers the white part of the eye and the inner surface of the eyelid (conjunctiva). Bacterial conjunctivitis spreads easily from eye to eye and from person to person (is contagious). Wash your hands often with soap and water for at least 20 seconds and especially before touching your face or eyes. Use paper towels to dry your hands. Take or apply your antibiotic medicine as told by your health care provider. Do not stop using the antibiotic even if your condition improves. Contact a health care provider if you have a fever or if your symptoms do not get better after 10 days. Get help right away if you have a sudden loss of vision. This information is not intended to replace advice given to you by your health care provider. Make sure you discuss any questions you have with your health care provider. Document Revised: 08/26/2020 Document Reviewed: 08/26/2020 Elsevier Patient Education  2024 [employer].

## 2024-06-03 ENCOUNTER — Ambulatory Visit
Admission: EM | Admit: 2024-06-03 | Discharge: 2024-06-03 | Disposition: A | Discharge status: Home / Self Care | Attending: Family Medicine | Admitting: Family Medicine

## 2024-06-03 DIAGNOSIS — R112 Nausea with vomiting, unspecified: Secondary | ICD-10-CM | POA: Diagnosis not present

## 2024-06-03 DIAGNOSIS — J069 Acute upper respiratory infection, unspecified: Secondary | ICD-10-CM

## 2024-06-03 LAB — POCT INFLUENZA A/B
Influenza A, POC: NEGATIVE
Influenza B, POC: NEGATIVE

## 2024-06-03 LAB — POC SOFIA SARS ANTIGEN FIA: SARS Coronavirus 2 Ag: NEGATIVE

## 2024-06-03 MED ORDER — ONDANSETRON 4 MG PO TBDP
4.0000 mg | ORAL_TABLET | Freq: Once | ORAL | Status: AC
  Administered 2024-06-03: 4 mg via ORAL

## 2024-06-03 MED ORDER — ONDANSETRON 4 MG PO TBDP: 4.0000 mg | ORAL_TABLET | Freq: Three times a day (TID) | ORAL | 0 refills | Status: AC | PRN

## 2024-06-03 MED ORDER — PROMETHAZINE-DM 6.25-15 MG/5ML PO SYRP: 5.0000 mL | ORAL_SOLUTION | Freq: Four times a day (QID) | ORAL | 0 refills | Status: AC | PRN

## 2024-06-03 NOTE — ED Provider Notes (Signed)
 " [facility]    CSN: [id number] Arrival date & time: 06/03/24  1754      History   Chief Complaint No chief complaint on file.   HPI Lisa Hanna is a 39 y.o. female.   Patient presenting today with 1 day history of bodyaches, cough, congestion, nausea, vomiting, fatigue.  Denies chest pain, shortness of breath, diarrhea, rashes.  Multiple sick contacts recently.  So far trying Delsym with minimal relief.    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   LSIL (low grade squamous intraepithelial lesion) on Pap smear 11/27/2010   Status post LEEP (loop electrosurgical excision procedure) of cervix 11/27/2010    Past Surgical History:  Procedure Laterality Date   HAND SURGERY     LEEP     VAGINAL HYSTERECTOMY N/A 08/02/2017   Procedure: HYSTERECTOMY VAGINAL;  Surgeon: Jayne Vonn DEL, MD;  Location: [facility];  Service: Gynecology;  Laterality: N/A;   WISDOM TOOTH EXTRACTION Bilateral     OB History     Gravida  5   Para  2   Term  2   Preterm      AB  3   Living  2      SAB  1   IAB      Ectopic      Multiple      Live Births               Home Medications    Prior to Admission medications  Medication Sig Start Date End Date Taking? Authorizing Provider  ondansetron  (ZOFRAN -ODT) 4 MG disintegrating tablet Take 1 tablet (4 mg total) by mouth every 8 (eight) hours as needed for nausea or vomiting. 06/03/24  Yes Stuart Vernell Norris, PA-C  promethazine -dextromethorphan (PROMETHAZINE -DM) 6.25-15 MG/5ML syrup Take 5 mLs by mouth 4 (four) times daily as needed. 06/03/24  Yes Stuart Vernell Norris, PA-C  linaclotide  (LINZESS ) 145  MCG CAPS capsule Take 1 capsule (145 mcg total) by mouth daily before breakfast. 02/20/24   Shirlean Therisa ORN, NP  linaclotide  (LINZESS ) 145 MCG CAPS capsule Take 1 capsule (145 mcg total) by mouth daily before breakfast. 03/09/24   Shirlean Therisa ORN, NP  metroNIDAZOLE  (METROGEL ) 0.75 % vaginal gel Place 1 Applicatorful vaginally 2 (two) times daily. 03/24/24   Rolan Berthold, PA-C    Family History Family History  Problem Relation Age of Onset   Asthma Mother    Hypertension Mother    Asthma Brother    Asthma Maternal Grandmother    Hypertension Maternal Grandmother    Asthma Daughter    Colon cancer Neg Hx    Colon polyps Neg Hx     Social History Social History[1]   Allergies   Latex   Review of Systems Review of Systems PER HPI  Physical Exam Triage Vital Signs ED Triage Vitals  Encounter Vitals Group     BP 06/03/24 1803 109/72     Girls Systolic BP Percentile --      Girls Diastolic BP Percentile --      Boys Systolic BP Percentile --      Boys Diastolic BP Percentile --  Pulse Rate 06/03/24 1803 (!) 107     Resp 06/03/24 1803 20     Temp 06/03/24 1803 98.6 F (37 C)     Temp Source 06/03/24 1803 Oral     SpO2 06/03/24 1803 95 %     Weight --      Height --      Head Circumference --      Peak Flow --      Pain Score 06/03/24 1805 10     Pain Loc --      Pain Education --      Exclude from Growth Chart --    No data found.  Updated Vital Signs BP 109/72 (BP Location: Right Arm)   Pulse (!) 107   Temp 98.6 F (37 C) (Oral)   Resp 20   LMP 07/23/2017   SpO2 95%   Visual Acuity Right Eye Distance:   Left Eye Distance:   Bilateral Distance:    Right Eye Near:   Left Eye Near:    Bilateral Near:     Physical Exam Vitals and nursing note reviewed.  Constitutional:      Appearance: Normal appearance.  HENT:     Head: Atraumatic.     Right Ear: Tympanic membrane and external ear normal.     Left Ear: Tympanic membrane and external ear normal.      Nose: Rhinorrhea present.     Mouth/Throat:     Mouth: Mucous membranes are moist.     Pharynx: Posterior oropharyngeal erythema present.  Eyes:     Extraocular Movements: Extraocular movements intact.     Conjunctiva/sclera: Conjunctivae normal.  Cardiovascular:     Rate and Rhythm: Normal rate and regular rhythm.     Heart sounds: Normal heart sounds.  Pulmonary:     Effort: Pulmonary effort is normal.     Breath sounds: Normal breath sounds. No wheezing or rales.  Musculoskeletal:        General: Normal range of motion.     Cervical back: Normal range of motion and neck supple.  Lymphadenopathy:     Cervical: No cervical adenopathy.  Skin:    General: Skin is warm and dry.  Neurological:     Mental Status: She is alert and oriented to person, place, and time.  Psychiatric:        Mood and Affect: Mood normal.        Thought Content: Thought content normal.      UC Treatments / Results  Labs (all labs ordered are listed, but only abnormal results are displayed) Labs Reviewed  POC SOFIA SARS ANTIGEN FIA  POCT INFLUENZA A/B    EKG   Radiology No results found.  Procedures Procedures (including critical care time)  Medications Ordered in UC Medications  ondansetron  (ZOFRAN -ODT) disintegrating tablet 4 mg (4 mg Oral Given 06/03/24 1843)    Initial Impression / Assessment and Plan / UC Course  I have reviewed the triage vital signs and the nursing notes.  Pertinent labs & imaging results that were available during my care of the patient were reviewed by me and considered in my medical decision making (see chart for details).     Vitals and exam reassuring, rapid flu and COVID-negative.  Zofran  given prior to discharge for active nausea and vomiting.  Suspect viral illness causing symptoms.  Treat with Zofran , Phenergan  DM, supportive over-the-counter medications and home care.  Return for worsening or unresolving symptoms.  Final Clinical Impressions(s) /  UC  Diagnoses   Final diagnoses:  Viral URI with cough  Nausea and vomiting, unspecified vomiting type   Discharge Instructions   None    ED Prescriptions     Medication Sig Dispense Auth. Provider   ondansetron  (ZOFRAN -ODT) 4 MG disintegrating tablet Take 1 tablet (4 mg total) by mouth every 8 (eight) hours as needed for nausea or vomiting. 20 tablet Stuart Vernell Norris, PA-C   promethazine -dextromethorphan (PROMETHAZINE -DM) 6.25-15 MG/5ML syrup Take 5 mLs by mouth 4 (four) times daily as needed. 100 mL Stuart Vernell Norris, [place]      PDMP not reviewed this encounter.    [1]  Social History Tobacco Use   Smoking status: Some Days    Current packs/day: 0.25    Average packs/day: 0.3 packs/day for 1 year (0.3 ttl pk-yrs)    Types: Cigarettes   Smokeless tobacco: Never  Vaping Use   Vaping status: Never Used  Substance Use Topics   Alcohol use: Yes    Comment: occassional   Drug use: No     Stuart Vernell Norris, PA-C 06/03/24 1922  "

## 2024-06-03 NOTE — ED Triage Notes (Signed)
 Pt reports body aches, cough, congestion, nausea and vomiting.
